# Patient Record
Sex: Female | Born: 2005 | Race: White | Hispanic: Yes | Marital: Single | Smoking: Never smoker
Health system: Southern US, Community
[De-identification: ages and names within clinical notes are randomized; demographics above are authoritative.]

## PROBLEM LIST (undated history)

## (undated) DIAGNOSIS — R51 Headache: Secondary | ICD-10-CM

## (undated) DIAGNOSIS — G479 Sleep disorder, unspecified: Secondary | ICD-10-CM

## (undated) DIAGNOSIS — J45909 Unspecified asthma, uncomplicated: Secondary | ICD-10-CM

## (undated) DIAGNOSIS — E669 Obesity, unspecified: Secondary | ICD-10-CM

## (undated) DIAGNOSIS — F419 Anxiety disorder, unspecified: Secondary | ICD-10-CM

## (undated) DIAGNOSIS — Z91018 Allergy to other foods: Secondary | ICD-10-CM

## (undated) DIAGNOSIS — R519 Headache, unspecified: Secondary | ICD-10-CM

## (undated) HISTORY — DX: Obesity, unspecified: E66.9

## (undated) HISTORY — DX: Anxiety disorder, unspecified: F41.9

## (undated) HISTORY — DX: Allergy to other foods: Z91.018

## (undated) HISTORY — PX: TONSILLECTOMY: SUR1361

## (undated) HISTORY — DX: Sleep disorder, unspecified: G47.9

## (undated) HISTORY — PX: STRABISMUS SURGERY: SHX218

---
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
## 2023-01-04 ENCOUNTER — Ambulatory Visit (INDEPENDENT_AMBULATORY_CARE_PROVIDER_SITE_OTHER): Payer: Commercial Managed Care - PPO

## 2023-01-04 ENCOUNTER — Other Ambulatory Visit: Payer: Self-pay | Admitting: Nurse Practitioner

## 2023-01-04 DIAGNOSIS — J309 Allergic rhinitis, unspecified: Secondary | ICD-10-CM | POA: Diagnosis not present

## 2023-01-04 MED ORDER — TRAZODONE HCL 50 MG PO TABS: 25.0000 mg | ORAL_TABLET | Freq: Every evening | ORAL | 0 refills | Status: DC | PRN

## 2023-01-18 ENCOUNTER — Other Ambulatory Visit: Payer: Self-pay | Admitting: Nurse Practitioner

## 2023-01-18 DIAGNOSIS — E669 Obesity, unspecified: Secondary | ICD-10-CM

## 2023-01-18 DIAGNOSIS — L83 Acanthosis nigricans: Secondary | ICD-10-CM

## 2023-01-25 ENCOUNTER — Telehealth: Payer: Commercial Managed Care - PPO | Admitting: Nurse Practitioner

## 2023-02-08 ENCOUNTER — Ambulatory Visit (INDEPENDENT_AMBULATORY_CARE_PROVIDER_SITE_OTHER): Payer: Commercial Managed Care - PPO | Admitting: *Deleted

## 2023-02-08 DIAGNOSIS — J309 Allergic rhinitis, unspecified: Secondary | ICD-10-CM

## 2023-02-14 DIAGNOSIS — Z0289 Encounter for other administrative examinations: Secondary | ICD-10-CM

## 2023-02-15 ENCOUNTER — Telehealth (INDEPENDENT_AMBULATORY_CARE_PROVIDER_SITE_OTHER): Payer: Commercial Managed Care - PPO | Admitting: Nurse Practitioner

## 2023-02-15 DIAGNOSIS — R0683 Snoring: Secondary | ICD-10-CM

## 2023-02-15 DIAGNOSIS — F419 Anxiety disorder, unspecified: Secondary | ICD-10-CM

## 2023-02-15 DIAGNOSIS — R5383 Other fatigue: Secondary | ICD-10-CM

## 2023-02-15 DIAGNOSIS — G479 Sleep disorder, unspecified: Secondary | ICD-10-CM

## 2023-02-15 MED ORDER — SERTRALINE HCL 50 MG PO TABS: ORAL_TABLET | ORAL | 0 refills | Status: DC

## 2023-02-15 MED ORDER — TRAZODONE HCL 50 MG PO TABS: 25.0000 mg | ORAL_TABLET | Freq: Every evening | ORAL | 0 refills | Status: AC | PRN

## 2023-02-16 ENCOUNTER — Encounter: Payer: Self-pay | Admitting: Nurse Practitioner

## 2023-02-16 NOTE — Progress Notes (Signed)
Virtual Visit via Video Note  I connected with Vanessa [name] on 02/16/23 at  1:40 PM EDT by a video enabled telemedicine application and verified that I am speaking with the correct person using two identifiers.  Location: Patient: home Provider: offive    I discussed the limitations of evaluation and management by telemedicine and the availability of in person appointments. The patient expressed understanding and agreed to proceed.  History of Present Illness: Presents by video for recheck of her anxiety.  Doing well on her current regimen.  Adherent to medication use.  Needs refills today.  Her main concern is continued difficulty with fatigue.  States she is getting plenty of sleep at nighttime with no issues using trazodone.  But staying very tired during the day and is even nodded off at work a few times.  Denies any excessive drowsiness with driving.  Has had a tonsillectomy in the past.  Has been told that she has very loud snoring most nights and her mother has had to wake her up at times due to pauses in her breathing.  Has Mirena for birth control, no cycles.    Observations/Objective: NAD.  Alert, oriented.  Calm cheerful affect. Arby [place]rette questionnaire was completed during the visit which was positive for possible sleep apnea.  See scanned report.  Assessment and Plan: 1. Anxiety Continue current dose of Zoloft as directed, refills given. - sertraline (ZOLOFT) 50 MG tablet; Take one tab po qd  Dispense: 90 tablet; Refill: 0 - Ambulatory referral to Pulmonology  2. Sleep disturbance Continue current dose of trazodone, refills given. - traZODone (DESYREL) 50 MG tablet; Take 0.5-1 tablets (25-50 mg total) by mouth at bedtime as needed for sleep.  Dispense: 90 tablet; Refill: 0 - Ambulatory referral to Pulmonology  3. Snoring Referred to specialist for home-based sleep test for possible OSA. - Ambulatory referral to Pulmonology  4. Fatigue, unspecified type Labs pending. -  Ambulatory referral to Pulmonology - CBC with Differential/Platelet - Comprehensive metabolic panel - TSH   Follow Up Instructions: Encourage patient to eat a healthy diet with regular exercise and discussed the importance of stress reduction.  Further follow-up based on lab results and testing for OSA. Return in about 3 months (around 05/18/2023). Call back sooner if needed.   I discussed the assessment and treatment plan with the patient. The patient was provided an opportunity to ask questions and all were answered. The patient agreed with the plan and demonstrated an understanding of the instructions.   The patient was advised to call back or seek an in-person evaluation if the symptoms worsen or if the condition fails to improve as anticipated.  I provided 20 minutes of non-face-to-face time during this encounter.   Nilda Simmer, NP

## 2023-02-18 ENCOUNTER — Encounter: Payer: Self-pay | Admitting: Nurse Practitioner

## 2023-02-18 ENCOUNTER — Other Ambulatory Visit: Payer: Self-pay | Admitting: *Deleted

## 2023-02-18 DIAGNOSIS — R5383 Other fatigue: Secondary | ICD-10-CM

## 2023-02-18 DIAGNOSIS — L83 Acanthosis nigricans: Secondary | ICD-10-CM

## 2023-02-19 ENCOUNTER — Encounter: Payer: Self-pay | Admitting: Nurse Practitioner

## 2023-02-20 ENCOUNTER — Encounter (INDEPENDENT_AMBULATORY_CARE_PROVIDER_SITE_OTHER): Payer: Commercial Managed Care - PPO | Admitting: Family Medicine

## 2023-02-21 LAB — CMP14+EGFR
ALT: 12 IU/L (ref 0–32)
AST: 10 IU/L (ref 0–40)
Albumin/Globulin Ratio: 1.7 (ref 1.2–2.2)
Albumin: 4.3 g/dL (ref 4.0–5.0)
Alkaline Phosphatase: 92 IU/L (ref 42–106)
BUN/Creatinine Ratio: 14 (ref 9–23)
BUN: 8 mg/dL (ref 6–20)
Bilirubin Total: 0.5 mg/dL (ref 0.0–1.2)
CO2: 21 mmol/L (ref 20–29)
Calcium: 9.8 mg/dL (ref 8.7–10.2)
Chloride: 102 mmol/L (ref 96–106)
Creatinine, Ser: 0.58 mg/dL (ref 0.57–1.00)
Globulin, Total: 2.5 g/dL (ref 1.5–4.5)
Glucose: 92 mg/dL (ref 70–99)
Potassium: 4 mmol/L (ref 3.5–5.2)
Sodium: 139 mmol/L (ref 134–144)
Total Protein: 6.8 g/dL (ref 6.0–8.5)
eGFR: 134 mL/min/{1.73_m2} (ref >59)

## 2023-02-21 LAB — CBC WITH DIFFERENTIAL/PLATELET
Basophils Absolute: 0.1 10*3/uL (ref 0.0–0.2)
Basos: 1 %
EOS (ABSOLUTE): 0.3 10*3/uL (ref 0.0–0.4)
Eos: 2 %
Hematocrit: 34.6 % (ref 34.0–46.6)
Hemoglobin: 10.7 g/dL — ABNORMAL LOW (ref 11.1–15.9)
Immature Grans (Abs): 0.1 10*3/uL (ref 0.0–0.1)
Immature Granulocytes: 1 %
Lymphocytes Absolute: 2.8 10*3/uL (ref 0.7–3.1)
Lymphs: 20 %
MCH: 19.9 pg — ABNORMAL LOW (ref 26.6–33.0)
MCHC: 30.9 g/dL — ABNORMAL LOW (ref 31.5–35.7)
MCV: 64 fL — ABNORMAL LOW (ref 79–97)
Monocytes Absolute: 0.9 10*3/uL (ref 0.1–0.9)
Monocytes: 6 %
Neutrophils Absolute: 9.7 10*3/uL — ABNORMAL HIGH (ref 1.4–7.0)
Neutrophils: 70 %
Platelets: 339 10*3/uL (ref 150–450)
RBC: 5.38 x10E6/uL — ABNORMAL HIGH (ref 3.77–5.28)
RDW: 19.8 % — ABNORMAL HIGH (ref 11.7–15.4)
WBC: 13.7 10*3/uL — ABNORMAL HIGH (ref 3.4–10.8)

## 2023-02-21 LAB — TSH: TSH: 1.22 u[IU]/mL (ref 0.450–4.500)

## 2023-03-01 ENCOUNTER — Ambulatory Visit (INDEPENDENT_AMBULATORY_CARE_PROVIDER_SITE_OTHER): Payer: Commercial Managed Care - PPO

## 2023-03-01 DIAGNOSIS — J309 Allergic rhinitis, unspecified: Secondary | ICD-10-CM | POA: Diagnosis not present

## 2023-03-02 ENCOUNTER — Encounter: Payer: Self-pay | Admitting: Nurse Practitioner

## 2023-03-05 ENCOUNTER — Ambulatory Visit (INDEPENDENT_AMBULATORY_CARE_PROVIDER_SITE_OTHER): Payer: Commercial Managed Care - PPO | Admitting: Pulmonary Disease

## 2023-03-05 ENCOUNTER — Encounter (HOSPITAL_BASED_OUTPATIENT_CLINIC_OR_DEPARTMENT_OTHER): Payer: Self-pay | Admitting: Pulmonary Disease

## 2023-03-05 VITALS — BP 100/60 | HR 64 | Ht 63.78 in | Wt 190.8 lb

## 2023-03-05 DIAGNOSIS — G4710 Hypersomnia, unspecified: Secondary | ICD-10-CM

## 2023-03-05 DIAGNOSIS — G479 Sleep disorder, unspecified: Secondary | ICD-10-CM | POA: Diagnosis not present

## 2023-03-05 DIAGNOSIS — R0683 Snoring: Secondary | ICD-10-CM | POA: Diagnosis not present

## 2023-03-05 DIAGNOSIS — J4520 Mild intermittent asthma, uncomplicated: Secondary | ICD-10-CM

## 2023-03-05 NOTE — Patient Instructions (Signed)
  X Home sleep test  We discussed treatment options  Try to schedule bedtime of 9pm when you have early wake up times

## 2023-03-05 NOTE — Assessment & Plan Note (Addendum)
Given excessive daytime somnolence, narrow pharyngeal exam & loud snoring, obstructive sleep apnea is very likely & an overnight polysomnogram will be scheduled as a home study. The pathophysiology of obstructive sleep apnea , it's cardiovascular consequences & modes of treatment including CPAP were discused with the patient in detail & they evidenced understanding.  Pretest probability is high. Differential diagnosis of hypersomnolence would include insufficient sleep, OSA and narcolepsy in his age group.  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  She can plan for an earlier bedtime at 9 PM She would be willing to consider CPAP if needed although she is distressed by the idea of taking CPAP to college.  If home sleep test is negative then we will plan for detailed testing with an PSG and MSLT

## 2023-03-05 NOTE — Progress Notes (Signed)
Subjective:    Patient ID: Vanessa [name], female    DOB: March 16, 2006, 17 y.o.   MRN: [id number]  HPI  17 year old high school daughter from [place] presents for evaluation of excessive daytime somnolence and fatigue. She reports symptoms ongoing for 3 months.  Epworth sleepiness score is 7.  She was sleepiness while sitting and reading, watching TV, sitting inactive .  She reports a couple of episodes where she had "sleep attacks" and fell asleep talking to somebody and had to be woken up. 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  On those mornings she has to wake up at 4:50 AM.  Other days she wakes up at 7:15 AM.  Bedtime is around 10 AM, sleep latency is minimal, she sleeps on her left side with 1 pillow, reports 2-3 nocturnal awakenings spontaneously and wakes up feeling tired with occasional headaches and dryness of mouth. She has gained 40 pounds in the last 2 years. Endocrine consultation is planned. There is no history suggestive of cataplexy, sleep paralysis or parasomnias  She reports increased anxiety for the past 3 months, try hydroxyzine first and then was given trazodone.  Mom reports a history of obesity and herself and her older sister both of them required gastric bypass. She reports increased anxiety when her stepfather passed away 4 years ago.   PMH -atopic dermatitis on allergy shots every 2 weeks -Dr. Dellis Anes Asthma, mild intermittent. Anxiety  She plans to go to [employer] next year to study public health   Past Medical History:  Diagnosis Date   Asthma    Headache    Idiopathic urticaria 07/27/2022   Intrinsic atopic dermatitis 07/27/2022   Past Surgical History:  Procedure Laterality Date   STRABISMUS SURGERY     TONSILLECTOMY      Allergies  Allergen Reactions   Shellfish Allergy Shortness Of Breath    Social History   Socioeconomic History   Marital status: Single    Spouse name: Not on file   Number of children: Not on file    Years of education: Not on file   Highest education level: Not on file  Occupational History   Not on file  Tobacco Use   Smoking status: Never   Smokeless tobacco: Never  Vaping Use   Vaping Use: Never used  Substance and Sexual Activity   Alcohol use: No   Drug use: No   Sexual activity: Never  Other Topics Concern   Not on file  Social History Narrative   Patient doing well in school.Living with her parents and older siblings. She enjoys dance, swimming, and tennis.    Social Determinants of Health   Financial Resource Strain: Low Risk  (10/26/2022)   Overall Financial Resource Strain (CARDIA)    Difficulty of Paying Living Expenses: Not hard at all  Food Insecurity: No Food Insecurity (10/26/2022)   Hunger Vital Sign    Worried About Running Out of Food in the Last Year: Never true    Ran Out of Food in the Last Year: Never true  Transportation Needs: No Transportation Needs (10/26/2022)   PRAPARE - Administrator, Civil Service (Medical): No    Lack of Transportation (Non-Medical): No  Physical Activity: Inactive (10/26/2022)   Exercise Vital Sign    Days of Exercise per Week: 0 days    Minutes of Exercise per Session: 0 min  Stress: No Stress Concern Present (10/26/2022)   [employer] of Occupational  Health - Occupational Stress Questionnaire    Feeling of Stress : Only a little  Social Connections: Moderately Integrated (10/26/2022)   Social Connection and Isolation Panel [NHANES]    Frequency of Communication with Friends and Family: More than three times a week    Frequency of Social Gatherings with Friends and Family: More than three times a week    Attends Religious Services: 1 to 4 times per year    Active Member of [employer] or Organizations: Yes    Attends Banker Meetings: More than 4 times per year    Marital Status: Never married  Intimate Partner Violence: Not At Risk (10/26/2022)   Humiliation, Afraid, Rape, and Kick  questionnaire    Fear of Current or Ex-Partner: No    Emotionally Abused: No    Physically Abused: No    Sexually Abused: No    Family History  Problem Relation Age of Onset   Migraines Mother    Headache Father    Allergic rhinitis Father    Migraines Sister    Depression Sister    Anxiety disorder Sister    Heart attack Maternal Grandfather    Heart attack Paternal Grandfather    Angioedema Neg Hx    Asthma Neg Hx    Eczema Neg Hx    Immunodeficiency Neg Hx    Urticaria Neg Hx       Review of Systems Constitutional: negative for anorexia, fevers and sweats  Eyes: negative for irritation, redness and visual disturbance  Ears, nose, mouth, throat, and face: negative for earaches, epistaxis, nasal congestion and sore throat  Respiratory: negative for cough, dyspnea on exertion, sputum and wheezing  Cardiovascular: negative for chest pain, dyspnea, lower extremity edema, orthopnea, palpitations and syncope  Gastrointestinal: negative for abdominal pain, constipation, diarrhea, melena, nausea and vomiting  Genitourinary:negative for dysuria, frequency and hematuria  Hematologic/lymphatic: negative for bleeding, easy bruising and lymphadenopathy  Musculoskeletal:negative for arthralgias, muscle weakness and stiff joints  Neurological: negative for coordination problems, gait problems, headaches and weakness  Endocrine: negative for diabetic symptoms including polydipsia, polyuria and weight loss     Objective:   Physical Exam  Gen. Pleasant, obese, in no distress, normal affect ENT - no pallor,icterus, no post nasal drip, class 2-3 airway Neck: No JVD, no thyromegaly, no carotid bruits Lungs: no use of accessory muscles, no dullness to percussion, decreased without rales or rhonchi  Cardiovascular: Rhythm regular, heart sounds  normal, no murmurs or gallops, no peripheral edema Abdomen: soft and non-tender, no hepatosplenomegaly, BS normal. Musculoskeletal: No  deformities, no cyanosis or clubbing Neuro:  alert, non focal, no tremors       Assessment & Plan:

## 2023-03-05 NOTE — Assessment & Plan Note (Addendum)
Appears to be mild intermittent while on allergy shots. Use albuterol on as-needed basis

## 2023-03-07 ENCOUNTER — Other Ambulatory Visit (HOSPITAL_COMMUNITY): Payer: Self-pay

## 2023-03-08 ENCOUNTER — Other Ambulatory Visit: Payer: Self-pay | Admitting: Nurse Practitioner

## 2023-03-08 ENCOUNTER — Ambulatory Visit (INDEPENDENT_AMBULATORY_CARE_PROVIDER_SITE_OTHER): Payer: Commercial Managed Care - PPO

## 2023-03-08 DIAGNOSIS — J309 Allergic rhinitis, unspecified: Secondary | ICD-10-CM | POA: Diagnosis not present

## 2023-03-08 DIAGNOSIS — D709 Neutropenia, unspecified: Secondary | ICD-10-CM

## 2023-03-08 DIAGNOSIS — D509 Iron deficiency anemia, unspecified: Secondary | ICD-10-CM

## 2023-03-08 DIAGNOSIS — D72829 Elevated white blood cell count, unspecified: Secondary | ICD-10-CM

## 2023-03-13 ENCOUNTER — Encounter (INDEPENDENT_AMBULATORY_CARE_PROVIDER_SITE_OTHER): Payer: Self-pay | Admitting: Family Medicine

## 2023-03-13 ENCOUNTER — Other Ambulatory Visit (HOSPITAL_COMMUNITY): Payer: Self-pay

## 2023-03-13 ENCOUNTER — Ambulatory Visit (INDEPENDENT_AMBULATORY_CARE_PROVIDER_SITE_OTHER): Payer: Commercial Managed Care - PPO | Admitting: Family Medicine

## 2023-03-13 VITALS — BP 106/70 | HR 53 | Temp 98.6°F | Ht 64.0 in | Wt 186.0 lb

## 2023-03-13 DIAGNOSIS — Z1331 Encounter for screening for depression: Secondary | ICD-10-CM | POA: Diagnosis not present

## 2023-03-13 DIAGNOSIS — R718 Other abnormality of red blood cells: Secondary | ICD-10-CM | POA: Diagnosis not present

## 2023-03-13 DIAGNOSIS — F411 Generalized anxiety disorder: Secondary | ICD-10-CM | POA: Diagnosis not present

## 2023-03-13 DIAGNOSIS — G479 Sleep disorder, unspecified: Secondary | ICD-10-CM

## 2023-03-13 DIAGNOSIS — R0602 Shortness of breath: Secondary | ICD-10-CM

## 2023-03-13 DIAGNOSIS — Z6832 Body mass index (BMI) 32.0-32.9, adult: Secondary | ICD-10-CM

## 2023-03-13 DIAGNOSIS — E669 Obesity, unspecified: Secondary | ICD-10-CM | POA: Diagnosis not present

## 2023-03-13 DIAGNOSIS — R739 Hyperglycemia, unspecified: Secondary | ICD-10-CM

## 2023-03-13 DIAGNOSIS — R5383 Other fatigue: Secondary | ICD-10-CM | POA: Diagnosis not present

## 2023-03-13 MED ORDER — INTEGRA 62.5-62.5-40-3 MG PO CAPS
1.0000 | ORAL_CAPSULE | Freq: Every day | ORAL | 0 refills | Status: DC
Start: 2023-03-13 — End: 2023-03-18

## 2023-03-13 MED ORDER — INTEGRA 62.5-62.5-40-3 MG PO CAPS
1.0000 | ORAL_CAPSULE | Freq: Every day | ORAL | 0 refills | Status: DC
  Filled 2023-03-13: qty 30, 30d supply, fill #0

## 2023-03-13 NOTE — Progress Notes (Signed)
Chief Complaint:   OBESITY Vanessa [name] (MR# [id number]) is a 17 y.o. female who presents for evaluation and treatment of obesity and related comorbidities. Current BMI is Body mass index is 31.93 kg/m. Vanessa [name] has been struggling with her weight for many years and has been unsuccessful in either losing weight, maintaining weight loss, or reaching her healthy weight goal.  Patient came to information session.  She goes to [employer] and works as a Public relations account executive at 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  Lifeguarding is 3 times a week from 5:30-9am.  School then until 11:50 and works in the office from 12-5p.   Lives at home with her mother Vanessa [name]).  She is anticipating some sabotage from her mother.  Both her mother and sister have had bariatric surgery in the past- mother had gastric bypass and sister had sleeve. Desired weight is 130-140 pounds.  Last time she was this weight was in middle school. Eats out 4-5 times a week at lunch.  Normally goes thru the drive thru at [employer] or [employer].  Gets a burrito or cheeseburger. Dislikes most vegetables except lettuce. Starbucks iced coffee (Iced white chocolate mocha - medium) and a grilled cheese (feels satisfied) at around 9:30.  Eats again at 12pm- this is fast food.  [employer] five layer burrito and a water.  After work she waits for her mom to come home to cook something  Last night she ate 2-3 eggs for dinner. Tends to emotionally eat daily especially at her second job.  She is drinking 1-2 sodas a day.   Vanessa [name] is currently in the action stage of change and ready to dedicate time achieving and maintaining a healthier weight. Vanessa [name] is interested in becoming our patient and working on intensive lifestyle modifications including (but not limited to) diet and exercise for weight loss.  Vanessa [name]'s habits were reviewed today and are as follows: she struggles with family  and or coworkers weight loss sabotage, her desired weight loss is 56 lbs, she has been heavy most of her life, she started gaining weight in the last 5 months, her heaviest weight ever was 190 pounds, she is a picky eater and doesn't like to eat healthier foods, she has significant food cravings issues, she snacks frequently in the evenings, she wakes up frequently in the middle of the night to eat, she is frequently drinking liquids with calories, she frequently makes poor food choices, she has problems with excessive hunger, she frequently eats larger portions than normal, she has binge eating behaviors, and she struggles with emotional eating.  Depression Screen Vanessa [name] Food and Mood (modified PHQ-9) score was 22.  Subjective:   1. Other fatigue Vanessa [name] admits to daytime somnolence and admits to waking up still tired. Patient has a history of symptoms of daytime fatigue, morning fatigue, and morning headache. Vanessa [name] generally gets about 6-8  hours of sleep per night, and states that she has nightime awakenings. Snoring is present. Apneic episodes are present. Epworth Sleepiness Score is 18.  EKG NSR at 55 bpm, sinus bradycardia.  2. SOBOE (shortness of breath on exertion) Vanessa [name] notes increasing shortness of breath with exercising and seems to be worsening over time with weight gain. She notes getting out of breath sooner with activity than she used to. This has not gotten worse recently. Vanessa [name] denies shortness of breath at rest or orthopnea.  3. Microcytosis Mirena placed in  December 2023.  Patient is not on iron, prenatal vitamin or a multivitamin.  MCV, 64, hemoglobin decreased, MCV increased.  4. Hyperglycemia Previously elevated blood sugars.  No A1c in Epic.  5. Sleep disturbance Patient saw Dr. Vassie Loll for evaluation.  Plan is for a home sleep study.  6. GAD (generalized anxiety disorder) Patient is on Zoloft 50 mg.  Patient has been on medications since early February 2024,  symptoms are well-managed.  Assessment/Plan:   1. Other fatigue Vanessa [name] does feel that her weight is causing her energy to be lower than it should be. Fatigue may be related to obesity, depression or many other causes. Labs will be ordered, and in the meanwhile, Vanessa [name] will focus on self care including making healthy food choices, increasing physical activity and focusing on stress reduction.  Check EKG, IC and labs today.  - EKG 12-Lead - Vitamin B12 - T3 - T4, free - VITAMIN D 25 Hydroxy (Vit-D Deficiency, Fractures)  2. SOBOE (shortness of breath on exertion) Vanessa [name] does feel that she gets out of breath more easily that she used to when she exercises. Vanessa [name] shortness of breath appears to be obesity related and exercise induced. She has agreed to work on weight loss and gradually increase exercise to treat her exercise induced shortness of breath. Will continue to monitor closely.  - Lipid Panel With LDL/HDL Ratio  3. Microcytosis Check labs today.  - Folate - Anemia panel  Start- Fe Fum-FePoly-Vit C-Vit B3 (INTEGRA) 62.5-62.5-40-3 MG CAPS; Take 1 capsule by mouth daily.  Dispense: 30 capsule; Refill: 0   4. Hyperglycemia Check labs today.  - Insulin, random - Hemoglobin A1c  5. Sleep disturbance Follow-up results after home sleep study.  6. GAD (generalized anxiety disorder) Continue sertraline.  No change in medication or dosing.  7. Depression screening Vanessa [name] had a positive depression screening. Depression is commonly associated with obesity and often results in emotional eating behaviors. We will monitor this closely and work on CBT to help improve the non-hunger eating patterns. Referral to Psychology may be required if no improvement is seen as she continues in our clinic.  8. Class 1 obesity with serious comorbidity and body mass index (BMI) of 32.0 to 32.9 in adult, unspecified obesity type Vanessa [name] is currently in the action stage of change and her goal is  to continue with weight loss efforts. I recommend Vanessa [name] begin the structured treatment plan as follows:  She has agreed to the Category 2 Plan and keeping a food journal and adhering to recommended goals of 200-250 calories and 20+ protein at breakfast, 300-350 calories and 25+ protein at lunch.  Exercise goals: No exercise has been prescribed at this time.   Behavioral modification strategies: increasing lean protein intake, meal planning and cooking strategies, keeping healthy foods in the home, and planning for success.  She was informed of the importance of frequent follow-up visits to maximize her success with intensive lifestyle modifications for her multiple health conditions. She was informed we would discuss her lab results at her next visit unless there is a critical issue that needs to be addressed sooner. Vanessa [name] agreed to keep her next visit at the agreed upon time to discuss these results.  Objective:   Blood pressure 106/70, pulse (!) 53, temperature 98.6 F (37 C), height 5\' 4"  (1.626 m), weight 186 lb (84.4 kg), last menstrual period 01/19/2023, SpO2 98 %. Body mass index is 31.93 kg/m.  EKG: Normal sinus rhythm, rate 55 bpm.  Indirect Calorimeter completed  today shows a VO2 of 240 and a REE of 1656.  Her calculated basal metabolic rate is 1610 thus her basal metabolic rate is better than expected.  General: Cooperative, alert, well developed, in no acute distress. HEENT: Conjunctivae and lids unremarkable. Cardiovascular: Regular rhythm.  Lungs: Normal work of breathing. Neurologic: No focal deficits.   Lab Results  Component Value Date   CREATININE 0.58 02/20/2023   BUN 8 02/20/2023   NA 139 02/20/2023   K 4.0 02/20/2023   CL 102 02/20/2023   CO2 21 02/20/2023   Lab Results  Component Value Date   ALT 12 02/20/2023   AST 10 02/20/2023   ALKPHOS 92 02/20/2023   BILITOT 0.5 02/20/2023   Lab Results  Component Value Date   HGBA1C 5.2 03/13/2023   Lab  Results  Component Value Date   INSULIN 20.4 03/13/2023   Lab Results  Component Value Date   TSH 1.220 02/20/2023   Lab Results  Component Value Date   CHOL 152 03/13/2023   HDL 50 03/13/2023   LDLCALC 89 03/13/2023   TRIG 67 03/13/2023   CHOLHDL 2.8 01/17/2018   Lab Results  Component Value Date   WBC 13.7 (H) 02/20/2023   HGB 10.7 (L) 02/20/2023   HCT 38.1 03/13/2023   MCV 64 (L) 02/20/2023   PLT 339 02/20/2023   Lab Results  Component Value Date   IRON 76 03/13/2023   TIBC 392 03/13/2023   FERRITIN 45 03/13/2023   Attestation Statements:   Reviewed by clinician on day of visit: allergies, medications, problem list, medical history, surgical history, family history, social history, and previous encounter notes.  Time spent on visit including pre-visit chart review and post-visit charting and care was 45 minutes.   I, Malcolm Metro, RMA, am acting as transcriptionist for Reuben Likes, MD. This is the patient's first visit at [facility]. The patient's NEW PATIENT PACKET was reviewed at length. Included in the packet: current and past health history, medications, allergies, ROS, gynecologic history (women only), surgical history, family history, social history, weight history, weight loss surgery history (for those that have had weight loss surgery), nutritional evaluation, mood and food questionnaire, PHQ9, Epworth questionnaire, sleep habits questionnaire, patient life and health improvement goals questionnaire. These will all be scanned into the patient's chart under media.   During the visit, I independently reviewed the patient's EKG, bioimpedance scale results, and indirect calorimeter results. I used this information to tailor a meal plan for the patient that will help her to lose weight and will improve her obesity-related conditions going forward. I performed a medically necessary appropriate examination and/or evaluation. I discussed the  assessment and treatment plan with the patient. The patient was provided an opportunity to ask questions and all were answered. The patient agreed with the plan and demonstrated an understanding of the instructions. Labs were ordered at this visit and will be reviewed at the next visit unless more critical results need to be addressed immediately. Clinical information was updated and documented in the EMR.    I have reviewed the above documentation for accuracy and completeness, and I agree with the above. - Reuben Likes, MD

## 2023-03-14 ENCOUNTER — Other Ambulatory Visit (HOSPITAL_COMMUNITY): Payer: Self-pay

## 2023-03-14 LAB — ANEMIA PANEL
Ferritin: 45 ng/mL (ref 15–77)
Folate, Hemolysate: 317 ng/mL
Folate, RBC: 832 ng/mL (ref >498)
Hematocrit: 38.1 % (ref 34.0–46.6)
Iron Saturation: 19 % (ref 15–55)
Iron: 76 ug/dL (ref 27–159)
Retic Ct Pct: 2.2 % (ref 0.6–2.6)
Total Iron Binding Capacity: 392 ug/dL (ref 250–450)
UIBC: 316 ug/dL (ref 131–425)
Vitamin B-12: 239 pg/mL (ref 232–1245)

## 2023-03-14 LAB — T4, FREE: Free T4: 1.23 ng/dL (ref 0.93–1.60)

## 2023-03-14 LAB — HEMOGLOBIN A1C
Est. average glucose Bld gHb Est-mCnc: 103 mg/dL
Hgb A1c MFr Bld: 5.2 % (ref 4.8–5.6)

## 2023-03-14 LAB — LIPID PANEL WITH LDL/HDL RATIO
Cholesterol, Total: 152 mg/dL (ref 100–169)
HDL: 50 mg/dL (ref >39)
LDL Chol Calc (NIH): 89 mg/dL (ref 0–109)
LDL/HDL Ratio: 1.8 ratio (ref 0.0–3.2)
Triglycerides: 67 mg/dL (ref 0–89)
VLDL Cholesterol Cal: 13 mg/dL (ref 5–40)

## 2023-03-14 LAB — VITAMIN D 25 HYDROXY (VIT D DEFICIENCY, FRACTURES): Vit D, 25-Hydroxy: 19.8 ng/mL — ABNORMAL LOW (ref 30.0–100.0)

## 2023-03-14 LAB — INSULIN, RANDOM: INSULIN: 20.4 u[IU]/mL (ref 2.6–24.9)

## 2023-03-14 LAB — FOLATE: Folate: 6 ng/mL (ref >3.0)

## 2023-03-14 LAB — T3: T3, Total: 150 ng/dL (ref 71–180)

## 2023-03-17 NOTE — Progress Notes (Signed)
[facility] [name], [place] [address]   Clinic Day:  03/18/2023  Referring physician: Campbell Riches, NP  Patient Care Team: Tommie Sams, DO as PCP - General (Family Medicine)   ASSESSMENT & PLAN:   Assessment:  1.  RBC microcytosis and leukocytosis: - Seen at the request of Baruch Merl, NP - CBC (02/20/2023): Hb-10.7, MCV-64. - 03/13/2023: Ferritin-45, percent saturation 19, folate-6, B12-239 - Integra (FeSO4) started on 03/15/2023 - History of menorrhagia and dysmenorrhea, Mirena IUD placed, no bleeding since February - Complains of fatigue, mostly in the middle of the day since February 2024  2.  Social/family history: - Mother has microcytosis, maternal grandmother also has microcytosis.  No family history of leukemia or malignancies.  No family history of known thalassemia or sickle cell anemia.  Plan:  1.  RBC microcytosis: - She has slightly low iron levels.  She started Integra tablet on 03/15/2023 and tolerated well so far.  She will continue the Integra tablet daily.  She will use stool softener if needed. - She also has borderline B12 levels.  She was told to take B12 1 mg tablet daily. - She has severe microcytosis in the past even with normal hemoglobin.  Suspect she has thalassemia trait. - I plan to see her back in 3 months.  I plan to check her CBC, ferritin, iron panel.  Will also check methylmalonic acid and copper levels. - I will check hemoglobin electrophoresis prior to next visit.  2.  Leukocytosis, predominantly neutrophils: - She has neutrophilic leukocytosis since 2019.  No B symptoms.  No prior history of splenectomy. - Will check for MPN including JAK2 V617F and BCR/ABL at next visit.  Orders Placed This Encounter  Procedures   CBC with Differential/Platelet    Standing Status:   Future    Standing Expiration Date:   03/17/2024    Order Specific Question:   Release to patient    Answer:   Immediate    Reticulocytes    Standing Status:   Future    Standing Expiration Date:   03/17/2024   Ferritin    Standing Status:   Future    Standing Expiration Date:   03/17/2024    Order Specific Question:   Release to patient    Answer:   Immediate   Lactate dehydrogenase    Standing Status:   Future    Standing Expiration Date:   03/17/2024    Order Specific Question:   Release to patient    Answer:   Immediate   Iron and TIBC    Standing Status:   Future    Standing Expiration Date:   03/17/2024    Order Specific Question:   Release to patient    Answer:   Immediate   Methylmalonic acid, serum    Standing Status:   Future    Standing Expiration Date:   03/17/2024   Copper, serum    Standing Status:   Future    Standing Expiration Date:   03/17/2024   JAK2 V617F rfx CALR/MPL/E12-15    Standing Status:   Future    Standing Expiration Date:   03/17/2024   BCR-ABL1 FISH    Standing Status:   Future    Standing Expiration Date:   03/17/2024   Hgb Fractionation Cascade    Standing Status:   Future    Standing Expiration Date:   03/17/2024      I,Katie Daubenspeck,acting as a scribe for  [name], MD.,have documented all relevant documentation on the behalf of [name], MD,as directed by  [name], MD while in the presence of [name], MD.   I, [name] MD, have reviewed the above documentation for accuracy and completeness, and I agree with the above.   [name], MD   4/22/20244:38 PM  CHIEF COMPLAINT/PURPOSE OF CONSULT:   Diagnosis: RBC microcytosis and leukocytosis  Current Therapy: Iron tablet and B12 tablet daily  HISTORY OF PRESENT ILLNESS:   Vanessa [name] is a 17 y.o. female presenting to clinic today for evaluation of anemia at the request of Sherie Don, NP.  Today, she states that she is doing well overall. Her appetite level is at 100%. Her energy level is at 40%.  She was found to have abnormal CBC from  02/20/23, at which time her WBC was 13.7 and hemoglobin 10.7. This was followed up by an anemia panel on 03/13/23, and results were all WNL-- iron 76, ferritin 45. She denies recent chest pain on exertion, shortness of breath on minimal exertion, pre-syncopal episodes, or palpitations. She had not noticed any recent bleeding such as epistaxis, hematuria or hematochezia The patient denies over the counter NSAID ingestion. She is not  on antiplatelets agents. She had no prior history or diagnosis of cancer. She denies any family history of cancer.  She denies any pica and eats a variety of diet. She never donated blood or received blood transfusion.  PAST MEDICAL HISTORY:   Past Medical History: Past Medical History:  Diagnosis Date   Anxiety    Asthma    Headache    Idiopathic urticaria 07/27/2022   Intrinsic atopic dermatitis 07/27/2022   Multiple food allergies    Obesity    Sleep disturbance     Surgical History: Past Surgical History:  Procedure Laterality Date   STRABISMUS SURGERY     TONSILLECTOMY      Social History: Social History   Socioeconomic History   Marital status: Single    Spouse name: Not on file   Number of children: Not on file   Years of education: Not on file   Highest education level: Not on file  Occupational History   Occupation: Consulting civil engineer, Work 2 jobs  Tobacco Use   Smoking status: Never   Smokeless tobacco: Never  Building services engineer Use: Never used  Substance and Sexual Activity   Alcohol use: No   Drug use: No   Sexual activity: Never  Other Topics Concern   Not on file  Social History Narrative   Patient doing well in school.Living with her parents and older siblings. She enjoys dance, swimming, and tennis.    Social Determinants of Health   Financial Resource Strain: Low Risk  (10/26/2022)   Overall Financial Resource Strain (CARDIA)    Difficulty of Paying Living Expenses: Not hard at all  Food Insecurity: No Food Insecurity  (03/18/2023)   Hunger Vital Sign    Worried About Running Out of Food in the Last Year: Never true    Ran Out of Food in the Last Year: Never true  Transportation Needs: No Transportation Needs (03/18/2023)   PRAPARE - Administrator, Civil Service (Medical): No    Lack of Transportation (Non-Medical): No  Physical Activity: Inactive (10/26/2022)   Exercise Vital Sign    Days of Exercise per Week: 0 days    Minutes of Exercise per Session: 0 min  Stress: No Stress Concern Present (10/26/2022)  Vanessa [name] of Occupational Health - Occupational Stress Questionnaire    Feeling of Stress : Only a little  Social Connections: Moderately Integrated (10/26/2022)   Social Connection and Isolation Panel [NHANES]    Frequency of Communication with Friends and Family: More than three times a week    Frequency of Social Gatherings with Friends and Family: More than three times a week    Attends Religious Services: 1 to 4 times per year    Active Member of [employer] or Organizations: Yes    Attends Engineer, structural: More than 4 times per year    Marital Status: Never married  Intimate Partner Violence: Not At Risk (03/18/2023)   Humiliation, Afraid, Rape, and Kick questionnaire    Fear of Current or Ex-Partner: No    Emotionally Abused: No    Physically Abused: No    Sexually Abused: No    Family History: Family History  Problem Relation Age of Onset   Cancer Mother    Hyperlipidemia Mother    Hypertension Mother    Diabetes Mother    Migraines Mother    Obesity Mother    Hyperlipidemia Father    Hypertension Father    Headache Father    Allergic rhinitis Father    Sleep apnea Father    Alcoholism Father    Migraines Sister    Depression Sister    Anxiety disorder Sister    Heart attack Maternal Grandfather    Heart attack Paternal Grandfather    Angioedema Neg Hx    Asthma Neg Hx    Eczema Neg Hx    Immunodeficiency Neg Hx    Urticaria Neg Hx      Current Medications:  Current Outpatient Medications:    albuterol (PROVENTIL) (2.5 MG/3ML) 0.083% nebulizer solution, Take 3 mLs (2.5 mg total) by nebulization every 4 (four) hours as needed for wheezing or shortness of breath., Disp: 75 mL, Rfl: 1   albuterol (VENTOLIN HFA) 108 (90 Base) MCG/ACT inhaler, Inhale 2 puffs into the lungs every 4 (four) hours as needed for wheezing or shortness of breath., Disp: 13.4 g, Rfl: 1   levonorgestrel (MIRENA) 20 MCG/DAY IUD, 1 each by Intrauterine route once., Disp: , Rfl:    sertraline (ZOLOFT) 50 MG tablet, Take one tab po qd, Disp: 90 tablet, Rfl: 0   traZODone (DESYREL) 50 MG tablet, Take 0.5-1 tablets (25-50 mg total) by mouth at bedtime as needed for sleep., Disp: 90 tablet, Rfl: 0   Fe Fum-FePoly-Vit C-Vit B3 (INTEGRA) 62.5-62.5-40-3 MG CAPS, Take 1 capsule by mouth daily., Disp: 30 capsule, Rfl: 6   Allergies: Allergies  Allergen Reactions   Shellfish Allergy Shortness Of Breath    REVIEW OF SYSTEMS:   Review of Systems  Constitutional:  Negative for chills, fatigue and fever.  HENT:   Negative for lump/mass, mouth sores, nosebleeds, sore throat and trouble swallowing.   Eyes:  Negative for eye problems.  Respiratory:  Negative for cough and shortness of breath.   Cardiovascular:  Negative for chest pain, leg swelling and palpitations.  Gastrointestinal:  Negative for abdominal pain, constipation, diarrhea, nausea and vomiting.  Genitourinary:  Negative for bladder incontinence, difficulty urinating, dysuria, frequency, hematuria and nocturia.   Musculoskeletal:  Negative for arthralgias, back pain, flank pain, myalgias and neck pain.  Skin:  Negative for itching and rash.  Neurological:  Negative for dizziness, headaches and numbness.  Hematological:  Does not bruise/bleed easily.  Psychiatric/Behavioral:  Positive for sleep disturbance. Negative for depression  and suicidal ideas. The patient is not nervous/anxious.   All other  systems reviewed and are negative.    VITALS:   Blood pressure 118/74, pulse 79, temperature 99.5 F (37.5 C), temperature source Tympanic, resp. rate 18, height 5\' 4"  (1.626 m), weight 188 lb 6.4 oz (85.5 kg), last menstrual period 01/19/2023, SpO2 100 %.  Wt Readings from Last 3 Encounters:  03/18/23 188 lb 6.4 oz (85.5 kg) (96 %, Z= 1.80)*  03/13/23 186 lb (84.4 kg) (96 %, Z= 1.76)*  03/05/23 190 lb 12.8 oz (86.5 kg) (97 %, Z= 1.84)*   * Growth percentiles are based on CDC (Girls, 2-20 Years) data.    Body mass index is 32.34 kg/m.   PHYSICAL EXAM:   Physical Exam Vitals and nursing note reviewed. Exam conducted with a chaperone present.  Constitutional:      Appearance: Normal appearance.  Cardiovascular:     Rate and Rhythm: Normal rate and regular rhythm.     Pulses: Normal pulses.     Heart sounds: Normal heart sounds.  Pulmonary:     Effort: Pulmonary effort is normal.     Breath sounds: Normal breath sounds.  Abdominal:     Palpations: Abdomen is soft. There is no hepatomegaly, splenomegaly or mass.     Tenderness: There is no abdominal tenderness.  Musculoskeletal:     Right lower leg: No edema.     Left lower leg: No edema.  Lymphadenopathy:     Cervical: No cervical adenopathy.     Right cervical: No superficial, deep or posterior cervical adenopathy.    Left cervical: No superficial, deep or posterior cervical adenopathy.     Upper Body:     Right upper body: No supraclavicular or axillary adenopathy.     Left upper body: No supraclavicular or axillary adenopathy.  Neurological:     General: No focal deficit present.     Mental Status: She is alert and oriented to person, place, and time.  Psychiatric:        Mood and Affect: Mood normal.        Behavior: Behavior normal.     LABS:      Latest Ref Rng & Units 03/13/2023    9:05 AM 02/20/2023    3:23 PM 07/29/2020    4:23 PM  CBC  WBC 3.4 - 10.8 x10E3/uL  13.7  11.8   Hemoglobin 11.1 - 15.9 g/dL   57.8  46.9   Hematocrit 34.0 - 46.6 % 38.1  34.6  36.3   Platelets 150 - 450 x10E3/uL  339  348       Latest Ref Rng & Units 02/20/2023    3:23 PM 07/29/2020    4:23 PM 05/25/2018    1:48 PM  CMP  Glucose 70 - 99 mg/dL 92  83  629   BUN 6 - 20 mg/dL 8  8  5    Creatinine 0.57 - 1.00 mg/dL 5.28  4.13  2.44   Sodium 134 - 144 mmol/L 139  137  138   Potassium 3.5 - 5.2 mmol/L 4.0  4.3  3.3   Chloride 96 - 106 mmol/L 102  101  105   CO2 20 - 29 mmol/L 21  21  24    Calcium 8.7 - 10.2 mg/dL 9.8  9.9  8.8   Total Protein 6.0 - 8.5 g/dL 6.8  7.4  7.7   Total Bilirubin 0.0 - 1.2 mg/dL 0.5  0.4  0.8   Alkaline Phos  42 - 106 IU/L 92  109  156   AST 0 - 40 IU/L 10  9  13    ALT 0 - 32 IU/L 12  7  11       No results found for: "CEA1", "CEA" / No results found for: "CEA1", "CEA" No results found for: "PSA1" No results found for: "ZOX096" No results found for: "CAN125"  No results found for: "TOTALPROTELP", "ALBUMINELP", "A1GS", "A2GS", "BETS", "BETA2SER", "GAMS", "MSPIKE", "SPEI" Lab Results  Component Value Date   TIBC 392 03/13/2023   TIBC 389 07/29/2020   FERRITIN 45 03/13/2023   FERRITIN 33 07/29/2020   IRONPCTSAT 19 03/13/2023   IRONPCTSAT 12 (L) 07/29/2020   No results found for: "LDH"   STUDIES:   No results found.

## 2023-03-18 ENCOUNTER — Other Ambulatory Visit (HOSPITAL_COMMUNITY): Payer: Self-pay

## 2023-03-18 ENCOUNTER — Inpatient Hospital Stay: Payer: Commercial Managed Care - PPO | Attending: Hematology | Admitting: Hematology

## 2023-03-18 VITALS — BP 118/74 | HR 79 | Temp 99.5°F | Resp 18 | Ht 64.0 in | Wt 188.4 lb

## 2023-03-18 DIAGNOSIS — R718 Other abnormality of red blood cells: Secondary | ICD-10-CM

## 2023-03-18 DIAGNOSIS — D72829 Elevated white blood cell count, unspecified: Secondary | ICD-10-CM | POA: Insufficient documentation

## 2023-03-18 DIAGNOSIS — D649 Anemia, unspecified: Secondary | ICD-10-CM | POA: Insufficient documentation

## 2023-03-18 DIAGNOSIS — D72825 Bandemia: Secondary | ICD-10-CM

## 2023-03-18 MED ORDER — INTEGRA 62.5-62.5-40-3 MG PO CAPS
1.0000 | ORAL_CAPSULE | Freq: Every day | ORAL | 6 refills | Status: DC
Start: 2023-03-18 — End: 2023-11-26
  Filled 2023-03-18: qty 30, 30d supply, fill #0

## 2023-03-18 NOTE — Patient Instructions (Addendum)
[facility]  Discharge Instructions  You were seen and examined today by Dr. Ellin Saba. Dr. Ellin Saba is a hematologist, meaning that he specializes in blood abnormalities. Dr. Ellin Saba discussed your past medical history, family history of cancers/blood conditions and the events that led to you being here today.  You were referred to Dr. Ellin Saba due to low iron levels.  Dr. Ellin Saba want you to continue taking the oral Iron until you follow up with him. You can also take Vitamin B12 over the counter.  Dr. Ellin Saba has recommended additional labs to be drawn for further evaluation before your follow up.  Follow-up as scheduled.    Thank you for choosing [facility] to provide your oncology and hematology care.   To afford each patient quality time with our provider, please arrive at least 15 minutes before your scheduled appointment time. You may need to reschedule your appointment if you arrive late (10 or more minutes). Arriving late affects you and other patients whose appointments are after yours.  Also, if you miss three or more appointments without notifying the office, you may be dismissed from the clinic at the provider's discretion.    Again, thank you for choosing [facility].  Our hope is that these requests will decrease the amount of time that you wait before being seen by our physicians.   If you have a lab appointment with the Cancer Center - please note that after April 8th, all labs will be drawn in the cancer center.  You do not have to check in or register with the main entrance as you have in the past but will complete your check-in at the cancer center.            _____________________________________________________________  Should you have questions after your visit to [facility], please contact our office at [phone] and follow the prompts.  Our office hours are 8:00  a.m. to 4:30 p.m. Monday - Thursday and 8:00 a.m. to 2:30 p.m. Friday.  Please note that voicemails left after 4:00 p.m. may not be returned until the following business day.  We are closed weekends and all major holidays.  You do have access to a nurse 24-7, just call the main number to the clinic [phone] and do not press any options, hold on the line and a nurse will answer the phone.    For prescription refill requests, have your pharmacy contact our office and allow 72 hours.    Masks are no longer required in the cancer centers. If you would like for your care team to wear a mask while they are taking care of you, please let them know. You may have one support person who is at least 19 years old accompany you for your appointments.

## 2023-03-21 ENCOUNTER — Ambulatory Visit: Payer: Commercial Managed Care - PPO | Admitting: Nurse Practitioner

## 2023-03-22 ENCOUNTER — Ambulatory Visit (INDEPENDENT_AMBULATORY_CARE_PROVIDER_SITE_OTHER): Payer: Commercial Managed Care - PPO

## 2023-03-22 DIAGNOSIS — J309 Allergic rhinitis, unspecified: Secondary | ICD-10-CM | POA: Diagnosis not present

## 2023-03-25 ENCOUNTER — Emergency Department (HOSPITAL_COMMUNITY): Payer: Commercial Managed Care - PPO

## 2023-03-25 ENCOUNTER — Emergency Department (HOSPITAL_COMMUNITY)
Admission: EM | Admit: 2023-03-25 | Discharge: 2023-03-26 | Disposition: A | Discharge status: Home / Self Care | Payer: Commercial Managed Care - PPO | Attending: Emergency Medicine | Admitting: Emergency Medicine

## 2023-03-25 ENCOUNTER — Encounter (HOSPITAL_COMMUNITY): Payer: Self-pay

## 2023-03-25 ENCOUNTER — Other Ambulatory Visit: Payer: Self-pay

## 2023-03-25 DIAGNOSIS — N12 Tubulo-interstitial nephritis, not specified as acute or chronic: Secondary | ICD-10-CM | POA: Insufficient documentation

## 2023-03-25 DIAGNOSIS — R109 Unspecified abdominal pain: Secondary | ICD-10-CM

## 2023-03-25 DIAGNOSIS — J45909 Unspecified asthma, uncomplicated: Secondary | ICD-10-CM | POA: Insufficient documentation

## 2023-03-25 DIAGNOSIS — Z7951 Long term (current) use of inhaled steroids: Secondary | ICD-10-CM | POA: Insufficient documentation

## 2023-03-25 DIAGNOSIS — D72829 Elevated white blood cell count, unspecified: Secondary | ICD-10-CM | POA: Insufficient documentation

## 2023-03-25 LAB — CBC
HCT: 38.5 % (ref 36.0–46.0)
Hemoglobin: 12.1 g/dL (ref 12.0–15.0)
MCH: 19.9 pg — ABNORMAL LOW (ref 26.0–34.0)
MCHC: 31.4 g/dL (ref 30.0–36.0)
MCV: 63.4 fL — ABNORMAL LOW (ref 80.0–100.0)
Platelets: 327 10*3/uL (ref 150–400)
RBC: 6.07 MIL/uL — ABNORMAL HIGH (ref 3.87–5.11)
RDW: 20.1 % — ABNORMAL HIGH (ref 11.5–15.5)
WBC: 15.2 10*3/uL — ABNORMAL HIGH (ref 4.0–10.5)
nRBC: 0 % (ref 0.0–0.2)

## 2023-03-25 LAB — URINALYSIS, ROUTINE W REFLEX MICROSCOPIC
Bilirubin Urine: NEGATIVE
Glucose, UA: NEGATIVE mg/dL
Ketones, ur: NEGATIVE mg/dL
Nitrite: NEGATIVE
Protein, ur: NEGATIVE mg/dL
Specific Gravity, Urine: 1.019 (ref 1.005–1.030)
WBC, UA: >50 WBC/hpf (ref 0–5)
pH: 6 (ref 5.0–8.0)

## 2023-03-25 LAB — COMPREHENSIVE METABOLIC PANEL
ALT: 13 U/L (ref 0–44)
AST: 13 U/L — ABNORMAL LOW (ref 15–41)
Albumin: 4.5 g/dL (ref 3.5–5.0)
Alkaline Phosphatase: 80 U/L (ref 38–126)
Anion gap: 9 (ref 5–15)
BUN: 13 mg/dL (ref 6–20)
CO2: 25 mmol/L (ref 22–32)
Calcium: 9.1 mg/dL (ref 8.9–10.3)
Chloride: 103 mmol/L (ref 98–111)
Creatinine, Ser: 0.5 mg/dL (ref 0.44–1.00)
GFR, Estimated: >60 mL/min (ref >60)
Glucose, Bld: 106 mg/dL — ABNORMAL HIGH (ref 70–99)
Potassium: 3.8 mmol/L (ref 3.5–5.1)
Sodium: 137 mmol/L (ref 135–145)
Total Bilirubin: 0.7 mg/dL (ref 0.3–1.2)
Total Protein: 8.2 g/dL — ABNORMAL HIGH (ref 6.5–8.1)

## 2023-03-25 LAB — PREGNANCY, URINE: Preg Test, Ur: NEGATIVE

## 2023-03-25 LAB — HCG, QUANTITATIVE, PREGNANCY: hCG, Beta Chain, Quant, S: <1 m[IU]/mL (ref <5)

## 2023-03-25 LAB — POC URINE PREG, ED: Preg Test, Ur: POSITIVE — AB

## 2023-03-25 MED ORDER — CEPHALEXIN 500 MG PO CAPS: 500.0000 mg | ORAL_CAPSULE | Freq: Four times a day (QID) | ORAL | 0 refills | Status: DC

## 2023-03-25 NOTE — Discharge Instructions (Addendum)
You were diagnosed today with a urinary tract infection which has affected the kidney.  I have prescribed antibiotics for coverage.  Please follow-up with me with your primary care provider.  You develop intractable nausea and vomiting or severe pain please return to the emergency department.

## 2023-03-25 NOTE — ED Triage Notes (Signed)
Pt arrived from home via POV c/o right flank pain 6/10 on pain scale accompanied with n/v. Emesis x 2 since this afternoon. Pt states that she noticed burning with urination 2 days ago and did not get it treated. Dysuria remains 7/10 when voiding.

## 2023-03-25 NOTE — ED Provider Notes (Signed)
EMERGENCY DEPARTMENT AT [facility] Provider Note   CSN: [id number] Arrival date & time: 03/25/23  1951     History  Chief Complaint  Patient presents with   Flank Pain    Vanessa [name] is a 17 y.o. female.  Patient presents the emergency room complaining of right-sided flank pain for the past 2 days.  She also endorses nausea, vomiting, and painful urination which also began 2 days ago.  She denies chest pain, shortness of breath, vaginal discharge, vaginal bleeding.  Past medical history significant for asthma, anxiety  HPI     Home Medications Prior to Admission medications   Medication Sig Start Date End Date Taking? Authorizing Provider  cephALEXin (KEFLEX) 500 MG capsule Take 1 capsule (500 mg total) by mouth 4 (four) times daily. 03/25/23  Yes Barrie Dunker B, PA-C  albuterol (PROVENTIL) (2.5 MG/3ML) 0.083% nebulizer solution Take 3 mLs (2.5 mg total) by nebulization every 4 (four) hours as needed for wheezing or shortness of breath. 07/27/22   Hetty Blend, FNP  albuterol (VENTOLIN HFA) 108 (90 Base) MCG/ACT inhaler Inhale 2 puffs into the lungs every 4 (four) hours as needed for wheezing or shortness of breath. 07/27/22   Ambs, Norvel Richards, FNP  Fe Fum-FePoly-Vit C-Vit B3 (INTEGRA) 62.5-62.5-40-3 MG CAPS Take 1 capsule by mouth daily. 03/18/23   [name], MD  levonorgestrel (MIRENA) 20 MCG/DAY IUD 1 each by Intrauterine route once.    [provider]  sertraline (ZOLOFT) 50 MG tablet Take one tab po qd 02/15/23   Campbell Riches, NP  traZODone (DESYREL) 50 MG tablet Take 0.5-1 tablets (25-50 mg total) by mouth at bedtime as needed for sleep. 02/15/23   Campbell Riches, NP      Allergies    Shellfish allergy    Review of Systems   Review of Systems  Physical Exam Updated Vital Signs BP 129/81 (BP Location: Right Arm)   Pulse 76   Temp 99 F (37.2 C) (Oral)   Resp 17   Ht 5\' 4"  (1.626 m)   Wt 85.3 kg   LMP 01/19/2023  (Approximate)   SpO2 100%   BMI 32.27 kg/m  Physical Exam Vitals and nursing note reviewed.  Constitutional:      General: She is not in acute distress.    Appearance: She is well-developed.  HENT:     Head: Normocephalic and atraumatic.  Eyes:     Conjunctiva/sclera: Conjunctivae normal.  Cardiovascular:     Rate and Rhythm: Normal rate and regular rhythm.     Heart sounds: No murmur heard. Pulmonary:     Effort: Pulmonary effort is normal. No respiratory distress.     Breath sounds: Normal breath sounds.  Abdominal:     Palpations: Abdomen is soft.     Tenderness: There is no abdominal tenderness. There is right CVA tenderness (Mild).  Musculoskeletal:        General: No swelling.     Cervical back: Neck supple.  Skin:    General: Skin is warm and dry.     Capillary Refill: Capillary refill takes less than 2 seconds.  Neurological:     Mental Status: She is alert.  Psychiatric:        Mood and Affect: Mood normal.     ED Results / Procedures / Treatments   Labs (all labs ordered are listed, but only abnormal results are displayed) Labs Reviewed  URINALYSIS, ROUTINE W REFLEX MICROSCOPIC - Abnormal; Notable for  the following components:      Result Value   APPearance HAZY (*)    Hgb urine dipstick MODERATE (*)    Leukocytes,Ua SMALL (*)    Bacteria, UA RARE (*)    All other components within normal limits  CBC - Abnormal; Notable for the following components:   WBC 15.2 (*)    RBC 6.07 (*)    MCV 63.4 (*)    MCH 19.9 (*)    RDW 20.1 (*)    All other components within normal limits  COMPREHENSIVE METABOLIC PANEL - Abnormal; Notable for the following components:   Glucose, Bld 106 (*)    Total Protein 8.2 (*)    AST 13 (*)    All other components within normal limits  POC URINE PREG, ED - Abnormal; Notable for the following components:   Preg Test, Ur Positive (*)    All other components within normal limits  HCG, QUANTITATIVE, PREGNANCY  PREGNANCY, URINE     EKG None  Radiology CT Renal Stone Study  Result Date: 03/25/2023 CLINICAL DATA:  Right-sided flank pain EXAM: CT ABDOMEN AND PELVIS WITHOUT CONTRAST TECHNIQUE: Multidetector CT imaging of the abdomen and pelvis was performed following the standard protocol without IV contrast. RADIATION DOSE REDUCTION: This exam was performed according to the departmental dose-optimization program which includes automated exposure control, adjustment of the mA and/or kV according to patient size and/or use of iterative reconstruction technique. COMPARISON:  None Available. FINDINGS: Lower chest: No acute abnormality. Hepatobiliary: No focal liver abnormality is seen. No gallstones, gallbladder wall thickening, or biliary dilatation. Pancreas: Unremarkable. No pancreatic ductal dilatation or surrounding inflammatory changes. Spleen: Normal in size without focal abnormality. Adrenals/Urinary Tract: Adrenal glands are within normal limits. Kidneys demonstrate a normal appearance bilaterally. No renal or ureteral calculi seen. No obstructive changes are noted. Bladder is decompressed. Stomach/Bowel: The appendix is within normal limits. No obstructive or inflammatory changes of the colon are noted. The stomach and small bowel are within normal limits. Vascular/Lymphatic: No significant vascular findings are present. No enlarged abdominal or pelvic lymph nodes. Reproductive: Uterus and bilateral adnexa are unremarkable. IUD is noted in place. Other: No abdominal wall hernia or abnormality. No abdominopelvic ascites. Musculoskeletal: No acute or significant osseous findings. IMPRESSION: No renal calculi or obstructive changes are noted. Normal-appearing appendix. Electronically Signed   By: Alcide Clever M.D.   On: 03/25/2023 23:24    Procedures Procedures    Medications Ordered in ED Medications - No data to display  ED Course/ Medical Decision Making/ A&P                             Medical Decision  Making Amount and/or Complexity of Data Reviewed Labs: ordered. Radiology: ordered.   Patient presents with a chief complaint of right-sided flank pain.  Differential diagnosis includes but not limited to nephrolithiasis, hydronephrosis, pyelonephritis, musculoskeletal strain, and others  I ordered and reviewed labs.  Pertinent results include WBC 15.2, normal creatinine, negative pregnancy test, UA with moderate hemoglobin, small leukocytes, rare bacteria  I ordered and interpreted imaging including a CT renal stone study.  Imaging showed no signs of renal stones or other acute abnormalities.  I agree with the radiologist findings.  Patient's presentation consistent with a mild pyelonephritis.  Plan to discharge home on antibiotics.  Plan for follow-up as needed with primary care.        Final Clinical Impression(s) / ED Diagnoses Final diagnoses:  Right flank pain  Pyelonephritis    Rx / DC Orders ED Discharge Orders          Ordered    cephALEXin (KEFLEX) 500 MG capsule  4 times daily        03/25/23 2351              Pamala Duffel 03/25/23 2353    Pricilla Loveless, MD 03/29/23 2218

## 2023-03-27 ENCOUNTER — Telehealth: Payer: Self-pay

## 2023-03-27 NOTE — Transitions of Care (Post Inpatient/ED Visit) (Signed)
   03/27/2023  Name: Vanessa [name] MRN: [id number] DOB: [birth year]  Today's TOC FU Call Status: Today's TOC FU Call Status:: Successful TOC FU Call Competed TOC FU Call Complete Date: 03/27/23  Transition Care Management Follow-up Telephone Call Date of Discharge: 03/26/23 [facility] Type of Discharge: Emergency Department Reason for ED Visit: Other: (abd pain) How have you been since you were released from the hospital?: Better Any questions or concerns?: No  Items Reviewed: Did you receive and understand the discharge instructions provided?: Yes Medications obtained,verified, and reconciled?: Yes (Medications Reviewed) Any new allergies since your discharge?: No Dietary orders reviewed?: Yes Do you have support at home?: No  Medications Reviewed Today: Medications Reviewed Today     Reviewed by Karena Addison, LPN (Licensed Practical Nurse) on 03/27/23 at 1412  Med List Status: <None>   Medication Order Taking? Sig Documenting Provider Last Dose Status Informant  albuterol (PROVENTIL) (2.5 MG/3ML) 0.083% nebulizer solution [id number] Yes Take 3 mLs (2.5 mg total) by nebulization every 4 (four) hours as needed for wheezing or shortness of breath. Hetty Blend, FNP Taking Active   albuterol (VENTOLIN HFA) 108 (90 Base) MCG/ACT inhaler [id number] Yes Inhale 2 puffs into the lungs every 4 (four) hours as needed for wheezing or shortness of breath. Hetty Blend, FNP Taking Active   cephALEXin (KEFLEX) 500 MG capsule [id number] Yes Take 1 capsule (500 mg total) by mouth 4 (four) times daily. Darrick Grinder, PA-C Taking Active   Fe Fum-FePoly-Vit C-Vit B3 (INTEGRA) 62.5-62.5-40-3 MG CAPS [id number] Yes Take 1 capsule by mouth daily. [name], MD Taking Active   levonorgestrel (MIRENA) 20 MCG/DAY IUD [id number] Yes 1 each by Intrauterine route once. [provider] Taking Active   sertraline (ZOLOFT) 50 MG tablet [id number] Yes Take one tab po  qd Campbell Riches, NP Taking Active   traZODone (DESYREL) 50 MG tablet [id number] Yes Take 0.5-1 tablets (25-50 mg total) by mouth at bedtime as needed for sleep. Campbell Riches, NP Taking Active             Home Care and Equipment/Supplies: Were Home Health Services Ordered?: NA Any new equipment or medical supplies ordered?: NA  Functional Questionnaire: Do you need assistance with bathing/showering or dressing?: No Do you need assistance with meal preparation?: No Do you need assistance with eating?: No Do you have difficulty maintaining continence: No Do you need assistance with getting out of bed/getting out of a chair/moving?: No Do you have difficulty managing or taking your medications?: No  Follow up appointments reviewed: PCP Follow-up appointment confirmed?: NA Specialist Hospital Follow-up appointment confirmed?: NA Do you need transportation to your follow-up appointment?: No Do you understand care options if your condition(s) worsen?: Yes-patient verbalized understanding    SIGNATURE Karena Addison, LPN [facility] Nurse Health Advisor Direct Dial [phone]

## 2023-03-29 ENCOUNTER — Ambulatory Visit (INDEPENDENT_AMBULATORY_CARE_PROVIDER_SITE_OTHER): Payer: Commercial Managed Care - PPO

## 2023-03-29 DIAGNOSIS — J309 Allergic rhinitis, unspecified: Secondary | ICD-10-CM | POA: Diagnosis not present

## 2023-04-03 ENCOUNTER — Other Ambulatory Visit (HOSPITAL_COMMUNITY): Payer: Self-pay

## 2023-04-03 ENCOUNTER — Encounter (INDEPENDENT_AMBULATORY_CARE_PROVIDER_SITE_OTHER): Payer: Self-pay | Admitting: Family Medicine

## 2023-04-03 ENCOUNTER — Ambulatory Visit (INDEPENDENT_AMBULATORY_CARE_PROVIDER_SITE_OTHER): Payer: Commercial Managed Care - PPO | Admitting: Family Medicine

## 2023-04-03 VITALS — BP 98/58 | HR 57 | Temp 98.4°F | Ht 64.0 in | Wt 184.0 lb

## 2023-04-03 DIAGNOSIS — R718 Other abnormality of red blood cells: Secondary | ICD-10-CM

## 2023-04-03 DIAGNOSIS — E669 Obesity, unspecified: Secondary | ICD-10-CM | POA: Diagnosis not present

## 2023-04-03 DIAGNOSIS — E559 Vitamin D deficiency, unspecified: Secondary | ICD-10-CM | POA: Diagnosis not present

## 2023-04-03 DIAGNOSIS — E88819 Insulin resistance, unspecified: Secondary | ICD-10-CM | POA: Diagnosis not present

## 2023-04-03 DIAGNOSIS — Z6831 Body mass index (BMI) 31.0-31.9, adult: Secondary | ICD-10-CM

## 2023-04-03 MED ORDER — VITAMIN D (ERGOCALCIFEROL) 1.25 MG (50000 UNIT) PO CAPS
50000.0000 [IU] | ORAL_CAPSULE | ORAL | 0 refills | Status: DC
Start: 2023-04-03 — End: 2023-11-30
  Filled 2023-04-03: qty 4, 28d supply, fill #0

## 2023-04-03 NOTE — Progress Notes (Signed)
Chief Complaint:   OBESITY Vanessa [name] is here to discuss her progress with her obesity treatment plan along with follow-up of her obesity related diagnoses. Vanessa [name] is on the Category 2 Plan and states she is following her eating plan approximately 70% of the time. Vanessa [name] states she is doing 0 minutes 0 times per week.  Today's visit was #: 2 Starting weight: 186 lbs Starting date: 03/13/2023 Today's weight: 184 lbs Today's date: 04/03/2023 Total lbs lost to date: 2 Total lbs lost since last in-office visit: 2  Interim History: First couple of weeks weren't as bad as she anticipated.  She snacked quite a bit and enjoyed lots of the options.She felt the food quantities were different than what she is used to.  Lunch she only has 30 minutes between school and work so that makes it difficult to get the food in.  She substituted a protein iced coffee for breakfast ([employer] iced coffee- 110 calories and 20g protein).  Lunch she is doing fast food which as been difficult to get in the calorie and protein range.  She is often eating at [employer] or [employer].  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 She thinks life schedule wise will get easier to follow as she is graduating and won't be working either of the current two jobs that she has.  Recent diagnosis with pyelonephritis.  Subjective:   1. Microcytosis Vanessa [name]'s last RBC was elevated, H/H within normal limits, and MCV very low. She saw Hematology/Oncology, likely thalassemia.   2. Insulin resistance Vanessa [name]'s last A1c was 5.2 and insulin 20.4.She is not on medications, and she is still having carbohydrate cravings.   3. Vitamin D deficiency Vanessa [name] is not on prescription Vitamin D. Her recent Vitamin D level was 19.8, and she notes fatigue.   Assessment/Plan:   1. Microcytosis We will follow-up on repeat labs in July.   2. Insulin resistance Pathophysiology of insulin resistance, pre-diabetes,  and diabetes were discussed today.   3. Vitamin D deficiency Vanessa [name] agreed to start Vitamin D 50,000 IU once weekly with no refills.   - Vitamin D, Ergocalciferol, (DRISDOL) 1.25 MG (50000 UNIT) CAPS capsule; Take 1 capsule (50,000 Units total) by mouth every 7 (seven) days.  Dispense: 4 capsule; Refill: 0  4. BMI 31.0-31.9,adult  5. Obesity with starting BMI of 32.0 Vanessa [name] is currently in the action stage of change. As such, her goal is to continue with weight loss efforts. She has agreed to the Category 2 Plan.   Exercise goals: No exercise has been prescribed at this time.  Behavioral modification strategies: increasing lean protein intake, meal planning and cooking strategies, keeping healthy foods in the home, and planning for success.  Vanessa [name] has agreed to follow-up with our clinic in 2 weeks. She was informed of the importance of frequent follow-up visits to maximize her success with intensive lifestyle modifications for her multiple health conditions.   Objective:   Blood pressure (!) 98/58, pulse (!) 57, temperature 98.4 F (36.9 C), height 5\' 4"  (1.626 m), weight 184 lb (83.5 kg), last menstrual period 01/19/2023, SpO2 99 %. Body mass index is 31.58 kg/m.  General: Cooperative, alert, well developed, in no acute distress. HEENT: Conjunctivae and lids unremarkable. Cardiovascular: Regular rhythm.  Lungs: Normal work of breathing. Neurologic: No focal deficits.   Lab Results  Component Value Date   CREATININE 0.50 03/25/2023   BUN 13 03/25/2023   NA  137 03/25/2023   K 3.8 03/25/2023   CL 103 03/25/2023   CO2 25 03/25/2023   Lab Results  Component Value Date   ALT 13 03/25/2023   AST 13 (L) 03/25/2023   ALKPHOS 80 03/25/2023   BILITOT 0.7 03/25/2023   Lab Results  Component Value Date   HGBA1C 5.2 03/13/2023   Lab Results  Component Value Date   INSULIN 20.4 03/13/2023   Lab Results  Component Value Date   TSH 1.220 02/20/2023   Lab Results   Component Value Date   CHOL 152 03/13/2023   HDL 50 03/13/2023   LDLCALC 89 03/13/2023   TRIG 67 03/13/2023   CHOLHDL 2.8 01/17/2018   Lab Results  Component Value Date   VD25OH 19.8 (L) 03/13/2023   Lab Results  Component Value Date   WBC 15.2 (H) 03/25/2023   HGB 12.1 03/25/2023   HCT 38.5 03/25/2023   MCV 63.4 (L) 03/25/2023   PLT 327 03/25/2023   Lab Results  Component Value Date   IRON 76 03/13/2023   TIBC 392 03/13/2023   FERRITIN 45 03/13/2023   Attestation Statements:   Reviewed by clinician on day of visit: allergies, medications, problem list, medical history, surgical history, family history, social history, and previous encounter notes.  Time spent on visit including pre-visit chart review and post-visit care and charting was 45 minutes.   I, Burt Knack, am acting as transcriptionist for Reuben Likes, MD.  I have reviewed the above documentation for accuracy and completeness, and I agree with the above. - Reuben Likes, MD

## 2023-04-15 ENCOUNTER — Ambulatory Visit: Payer: Commercial Managed Care - PPO | Admitting: Plastic Surgery

## 2023-04-15 ENCOUNTER — Encounter: Payer: Self-pay | Admitting: Plastic Surgery

## 2023-04-15 VITALS — BP 108/74 | HR 57 | Ht 64.0 in | Wt 191.0 lb

## 2023-04-15 DIAGNOSIS — M542 Cervicalgia: Secondary | ICD-10-CM | POA: Diagnosis not present

## 2023-04-15 DIAGNOSIS — N62 Hypertrophy of breast: Secondary | ICD-10-CM | POA: Diagnosis not present

## 2023-04-15 DIAGNOSIS — Z6832 Body mass index (BMI) 32.0-32.9, adult: Secondary | ICD-10-CM

## 2023-04-15 DIAGNOSIS — M546 Pain in thoracic spine: Secondary | ICD-10-CM

## 2023-04-15 NOTE — Progress Notes (Signed)
Referring Provider Tommie Sams, DO [address] [name],  [place] [address]   CC:  Chief Complaint  Patient presents with   Advice Only      Vanessa [name] is an 17 y.o. female.  HPI: Vanessa [name] is an 17 year old female who presents today for evaluation of large breasts which are causing upper back and neck pain.  Patient states that she is unable to find bras that fit appropriately to relieve the upper back and neck pain.  She states it has been going on since she developed breast as a teenager.  She is interested in surgical reduction of the size of her breast.  She is accompanied by her mother today who has herself undergone a breast reduction.  The patient denies any other specific breast complaints.  Allergies  Allergen Reactions   Shellfish Allergy Shortness Of Breath    Outpatient Encounter Medications as of 04/15/2023  Medication Sig   albuterol (PROVENTIL) (2.5 MG/3ML) 0.083% nebulizer solution Take 3 mLs (2.5 mg total) by nebulization every 4 (four) hours as needed for wheezing or shortness of breath.   albuterol (VENTOLIN HFA) 108 (90 Base) MCG/ACT inhaler Inhale 2 puffs into the lungs every 4 (four) hours as needed for wheezing or shortness of breath.   Fe Fum-FePoly-Vit C-Vit B3 (INTEGRA) 62.5-62.5-40-3 MG CAPS Take 1 capsule by mouth daily.   levonorgestrel (MIRENA) 20 MCG/DAY IUD 1 each by Intrauterine route once.   sertraline (ZOLOFT) 50 MG tablet Take one tab po qd   traZODone (DESYREL) 50 MG tablet Take 0.5-1 tablets (25-50 mg total) by mouth at bedtime as needed for sleep.   Vitamin D, Ergocalciferol, (DRISDOL) 1.25 MG (50000 UNIT) CAPS capsule Take 1 capsule (50,000 Units total) by mouth every 7 (seven) days.   [DISCONTINUED] cephALEXin (KEFLEX) 500 MG capsule Take 1 capsule (500 mg total) by mouth 4 (four) times daily. (Patient not taking: Reported on 04/15/2023)   No facility-administered encounter medications on file as of 04/15/2023.     Past Medical  History:  Diagnosis Date   Anxiety    Asthma    Headache    Idiopathic urticaria 07/27/2022   Intrinsic atopic dermatitis 07/27/2022   Multiple food allergies    Obesity    Sleep disturbance     Past Surgical History:  Procedure Laterality Date   STRABISMUS SURGERY     TONSILLECTOMY      Family History  Problem Relation Age of Onset   Cancer Mother    Hyperlipidemia Mother    Hypertension Mother    Diabetes Mother    Migraines Mother    Obesity Mother    Hyperlipidemia Father    Hypertension Father    Headache Father    Allergic rhinitis Father    Sleep apnea Father    Alcoholism Father    Migraines Sister    Depression Sister    Anxiety disorder Sister    Heart attack Maternal Grandfather    Heart attack Paternal Grandfather    Angioedema Neg Hx    Asthma Neg Hx    Eczema Neg Hx    Immunodeficiency Neg Hx    Urticaria Neg Hx     Social History   Social History Narrative   Patient doing well in school.Living with her parents and older siblings. She enjoys dance, swimming, and tennis.      Review of Systems General: Denies fevers, chills, weight loss CV: Denies chest pain, shortness of breath, palpitations Breast: Large breasts  contributing to upper back and neck pain.  No history of breast biopsies or breast pathology.  Physical Exam    04/15/2023    1:07 PM 04/03/2023   10:10 AM 04/03/2023   10:00 AM  Vitals with BMI  Height 5\' 4"   5\' 4"   Weight 191 lbs  184 lbs  BMI 32.77  31.57  Systolic 108 98 94  Diastolic 74 58 55  Pulse 57  57    General:  No acute distress,  Alert and oriented, Non-Toxic, Normal speech and affect Breast: Patient has moderately large but very dense breasts.  She has grade 3 ptosis bilaterally.  Her sternal notch to nipple distance on the right is 30 cm and 31 cm on the left her fold to nipple distance on the right is 10 cm and 11 cm on the left. Mammogram: Not applicable due to age Assessment/Plan Macromastia: The patient  feels that her breasts are contributing to her upper back and neck pain.  I believe that I can remove 500 g per breast.  I have shared this with the patient as this is likely to be less the insurance company will require however in her case I do think that it would result through her symptoms.  I have discussed breast reduction at length with the patient.  I have shown her the location of the incisions and the unpredictable nature of scarring.  We discussed the risks of bleeding, infection, and seroma formation.  She understands I will use drains postoperatively.  We discussed the specific risk of nipple loss to nipple ischemia.  We also discussed the fact that approximately 9% of women who have a breast reduction being unsuccessful in breast-feeding postoperatively.  We discussed the postoperative restrictions including no heavy lifting greater than 20 pounds, no vigorous activity, and no submerging the incisions in water for 6 weeks.  The patient will have to wear a compressive garment for 6 weeks.  She may return to light activity as tolerated.  All questions were answered to her satisfaction.  Photographs were obtained today with her consent.  Will submit her for a breast reduction request.  Vanessa [name] 04/15/2023, 1:44 PM

## 2023-04-16 ENCOUNTER — Encounter (HOSPITAL_COMMUNITY): Payer: Self-pay | Admitting: Psychiatry

## 2023-04-16 ENCOUNTER — Telehealth: Payer: Self-pay | Admitting: Pulmonary Disease

## 2023-04-16 ENCOUNTER — Encounter (INDEPENDENT_AMBULATORY_CARE_PROVIDER_SITE_OTHER): Payer: Commercial Managed Care - PPO

## 2023-04-16 ENCOUNTER — Ambulatory Visit (INDEPENDENT_AMBULATORY_CARE_PROVIDER_SITE_OTHER): Payer: Commercial Managed Care - PPO | Admitting: Psychiatry

## 2023-04-16 DIAGNOSIS — F419 Anxiety disorder, unspecified: Secondary | ICD-10-CM | POA: Diagnosis not present

## 2023-04-16 DIAGNOSIS — R0683 Snoring: Secondary | ICD-10-CM | POA: Diagnosis not present

## 2023-04-16 DIAGNOSIS — G479 Sleep disorder, unspecified: Secondary | ICD-10-CM

## 2023-04-16 NOTE — Telephone Encounter (Signed)
HST did not show any evidence of sleep apnea. This is good news but it means we have to figure out what is causing her excessive daytime sleepiness. We have to investigate for narcolepsy. This will require an overnight sleep study at the sleep center followed by a nap testing/MSLT in the daytime.  She would to take the day off for this test. We had discussed this type of testing during office visit Okay to schedule in PSG & MSLT if she is willing to proceed

## 2023-04-17 ENCOUNTER — Ambulatory Visit (INDEPENDENT_AMBULATORY_CARE_PROVIDER_SITE_OTHER): Payer: Commercial Managed Care - PPO | Admitting: Family Medicine

## 2023-04-17 NOTE — Progress Notes (Signed)
IN-PERSON  Comprehensive Clinical Assessment (CCA) Note  04/17/2023 Vanessa [name] [id number]  Chief Complaint:  Chief Complaint  Patient presents with   Anxiety   Visit Diagnosis: Anxiety disorder unspecified    Patient Determined To Be At Risk for Harm To Self or Others Based on Review of Patient Reported Information or Presenting Complaint? No  Method: No Plan  Availability of Means: No access or NA  Intent: Vague intent or NA  Are There Guns or Other Weapons in Your Home? No  CCA Biopsychosocial Intake/Chief Complaint:  "My anxiety is really bad, I get this fluttering feeling in my stomach at night, I have thoughts of there is something I need to do, what if thoughts'  Current Symptoms/Problems: nervousness, worrying, anxious, easily irritated, muscle tension,   Patient Reported Schizophrenia/Schizoaffective Diagnosis in Past: No   Strengths: desire for improvment  Preferences: Individual therapy  Abilities: "I can't think of any"   Type of Services Patient Feels are Needed: Individual therapy/ find ways to calm me down better, need better ways to cope   Initial Clinical Notes/Concerns: Pt is referred forl services by PA Sherie Don due to pt experiencing stress and symptoms of anxiety. She denies any psychiatric hospitalizations. She reports no previous involvement in outpatient therapy.   Mental Health Symptoms Depression:   Change in energy/activity; Difficulty Concentrating; Fatigue; Irritability; Sleep (too much or little); Weight gain/loss   Duration of Depressive symptoms:  Greater than two weeks   Mania:   Change in energy/activity; Irritability   Anxiety:    Difficulty concentrating; Fatigue; Irritability; Sleep; Tension; Worrying   Psychosis:   None   Duration of Psychotic symptoms: No data recorded  Trauma:   None   Obsessions:   None   Compulsions:   None   Inattention:   None   Hyperactivity/Impulsivity:   None    Oppositional/Defiant Behaviors:   None   Emotional Irregularity:   None   Other Mood/Personality Symptoms:  No data recorded   Mental Status Exam Appearance and self-care  Stature:   Average   Weight:   Overweight   Clothing:   Casual   Grooming:   Normal   Cosmetic use:   Age appropriate   Posture/gait:   Normal   Motor activity:   Not Remarkable   Sensorium  Attention:   Normal   Concentration:   Normal   Orientation:   Time   Recall/memory:   Normal   Affect and Mood  Affect:   Anxious   Mood:   Anxious   Relating  Eye contact:   Normal   Facial expression:   Responsive   Attitude toward examiner:   Cooperative   Thought and Language  Speech flow:  Normal   Thought content:   Appropriate to Mood and Circumstances   Preoccupation:   Ruminations   Hallucinations:   None   Organization:  No data recorded  [employer] of Knowledge:   Good   Intelligence:   Average   Abstraction:   Normal   Judgement:   Good   Reality Testing:   Realistic   Insight:   Good   Decision Making:   Normal   Social Functioning  Social Maturity:   Responsible   Social Judgement:   Normal   Stress  Stressors:   Transitions (going to college in the fall, expenses associated with this)   Coping Ability:   Normal   Skill Deficits:  No data recorded  Supports:  Family; Friends/Service system     Religion: Religion/Spirituality Are You A Religious Person?: No  Leisure/Recreation: Leisure / Recreation Do You Have Hobbies?: Yes Leisure and Hobbies: watch sports particularly motor sports, going to movies, playing tennis, hanging out with friends.  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) What is the Longest Time Patient has Held a Job?: 2 years 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 [employer]  Education: Education Did Garment/textile technologist From [employer]?: Yes (Graudated from [employer]) Did Theme park manager?: Yes What Type of College Degree Do you Have?: Associates in science from [employer], will attend [employer] this fall Did You Have Any Scientist, research (life sciences) In School?: [employer], swimming, also played piano Did You Have An Individualized Education Program (IIEP): No Did You Have Any Difficulty At [employer]?: No Patient's Education Has Been Impacted by Current Illness: No   CCA Family/Childhood History Family and Relationship History: Family history Marital status: Single (Pt resides with mother in [place].) Are you sexually active?: No What is your sexual orientation?: straight Does patient have children?: No  Childhood History:  Childhood History By whom was/is the patient raised?: Both parents (Parents separated when pt was 32, pt then resided with mother but pt had regular contact with father.) Additional childhood history information: Pt was born and reared in [place], [place]. Description of patient's relationship with caregiver when they were a child: pretty good, always like a daddy's girl Patient's description of current relationship with people who raised him/her: still good How were you disciplined when you got in trouble as a child/adolescent?: I didn't get in trouble much, probably my mom yelling at me Does patient  have siblings?: Yes Number of Siblings: 3 Description of patient's current relationship with siblings: one sibling is deceased, get along fine with sister, talks to brother who lives in [place] about once per year Did patient suffer any verbal/emotional/physical/sexual abuse as a child?: No Did patient suffer from severe childhood neglect?: No Has patient ever been sexually abused/assaulted/raped as an adolescent or adult?: No Was the patient ever a victim of a crime or a disaster?: No Witnessed domestic violence?: No Has patient been affected by domestic violence as an adult?: No  Child/Adolescent Assessment:  None     CCA Substance Use Alcohol/Drug Use: Alcohol / Drug Use Pain Medications: see patient record Prescriptions: see patient record Over the Counter: see patient record History of alcohol / drug use?: No history of alcohol / drug abuse       ASAM's:  Six Dimensions of Multidimensional Assessment  Dimension 1:  Acute Intoxication and/or Withdrawal Potential:   Dimension 1:  Description of individual's past and current experiences of substance use and withdrawal: none  Dimension 2:  Biomedical Conditions and Complications:   Dimension 2:  Description of patient's biomedical conditions and  complications: none  Dimension 3:  Emotional, Behavioral, or Cognitive Conditions and Complications:  Dimension 3:  Description of  emotional, behavioral, or cognitive conditions and complications: none  Dimension 4:  Readiness to Change:  Dimension 4:  Description of Readiness to Change criteria: none  Dimension 5:  Relapse, Continued use, or Continued Problem Potential:  Dimension 5:  Relapse, continued use, or continued problem potential critiera description: none  Dimension 6:  Recovery/Living Environment:  Dimension 6:  Recovery/Iiving environment criteria description: nonoe  ASAM Severity Score: ASAM's Severity Rating Score: 0  ASAM Recommended Level of Treatment:     Substance  use Disorder (SUD) None  Recommendations for Services/Supports/Treatments: Recommendations for Services/Supports/Treatments Recommendations For Services/Supports/Treatments: Individual Therapy/patient attends the assessment appointment.  Confidentiality and limits were discussed.  Nutritional assessment, pain assessment, PHQ 2 and C-SSRS, and GAD-7 administered.  Individual therapy is recommended 1 time every 1 to 4 weeks to improve coping skills to manage stress and anxiety.  Patient agrees to return for an appointment in 3 to 4 weeks.  Patient will continue to see the PA Sherie Don for medication management.  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  Patient/Guardian was advised Release of Information must be obtained prior to any record release in order to collaborate  their care with an outside provider. Patient/Guardian was advised if they have not already done so to contact the registration department to sign all necessary forms in order for [place] to release information regarding their care.   Consent: Patient/Guardian gives verbal consent for treatment and assignment of benefits for services provided during this visit. Patient/Guardian expressed understanding and agreed to proceed.   Musette Kisamore E Taye Cato, LCSW

## 2023-04-18 ENCOUNTER — Ambulatory Visit (HOSPITAL_COMMUNITY): Payer: Commercial Managed Care - PPO | Admitting: Psychiatry

## 2023-04-19 NOTE — Telephone Encounter (Signed)
Called and spoke w/ pt she verbalized that she needs to discuss this with her mother and will call us back next week.

## 2023-04-26 ENCOUNTER — Ambulatory Visit (INDEPENDENT_AMBULATORY_CARE_PROVIDER_SITE_OTHER): Payer: Commercial Managed Care - PPO

## 2023-04-26 DIAGNOSIS — J309 Allergic rhinitis, unspecified: Secondary | ICD-10-CM | POA: Diagnosis not present

## 2023-05-17 ENCOUNTER — Ambulatory Visit (INDEPENDENT_AMBULATORY_CARE_PROVIDER_SITE_OTHER): Payer: Commercial Managed Care - PPO

## 2023-05-17 DIAGNOSIS — J309 Allergic rhinitis, unspecified: Secondary | ICD-10-CM | POA: Diagnosis not present

## 2023-05-21 ENCOUNTER — Telehealth: Payer: Self-pay

## 2023-05-21 NOTE — Telephone Encounter (Signed)
Patient's mother called to see when her daughter is supposed to have surgery or if we have heard anything from the insurance company regarding approval.  Advised patient I do not see anything regarding insurance besides her last office visit.  Please follow up.

## 2023-05-21 NOTE — Telephone Encounter (Signed)
I spoke with Vanessa [name]'s mother and updated her on the process for submitting cases for insurance pre-authorization. She is aware someone from our office will contact her after the case has been submitted and we have a response from the insurance company

## 2023-05-24 ENCOUNTER — Ambulatory Visit (INDEPENDENT_AMBULATORY_CARE_PROVIDER_SITE_OTHER): Payer: Commercial Managed Care - PPO

## 2023-05-24 DIAGNOSIS — J309 Allergic rhinitis, unspecified: Secondary | ICD-10-CM

## 2023-05-29 ENCOUNTER — Ambulatory Visit (INDEPENDENT_AMBULATORY_CARE_PROVIDER_SITE_OTHER): Payer: Commercial Managed Care - PPO

## 2023-05-29 DIAGNOSIS — J309 Allergic rhinitis, unspecified: Secondary | ICD-10-CM | POA: Diagnosis not present

## 2023-05-31 ENCOUNTER — Inpatient Hospital Stay: Payer: Commercial Managed Care - PPO

## 2023-06-03 ENCOUNTER — Inpatient Hospital Stay: Payer: Commercial Managed Care - PPO | Attending: Hematology

## 2023-06-03 DIAGNOSIS — D509 Iron deficiency anemia, unspecified: Secondary | ICD-10-CM | POA: Insufficient documentation

## 2023-06-03 DIAGNOSIS — D72829 Elevated white blood cell count, unspecified: Secondary | ICD-10-CM | POA: Diagnosis present

## 2023-06-03 DIAGNOSIS — R718 Other abnormality of red blood cells: Secondary | ICD-10-CM

## 2023-06-03 LAB — CBC WITH DIFFERENTIAL/PLATELET
Abs Immature Granulocytes: 0.04 10*3/uL (ref 0.00–0.07)
Basophils Absolute: 0.1 10*3/uL (ref 0.0–0.1)
Basophils Relative: 1 %
Eosinophils Absolute: 0.3 10*3/uL (ref 0.0–0.5)
Eosinophils Relative: 2 %
HCT: 34.6 % — ABNORMAL LOW (ref 36.0–46.0)
Hemoglobin: 10.8 g/dL — ABNORMAL LOW (ref 12.0–15.0)
Immature Granulocytes: 0 %
Lymphocytes Relative: 23 %
Lymphs Abs: 2.9 10*3/uL (ref 0.7–4.0)
MCH: 19.6 pg — ABNORMAL LOW (ref 26.0–34.0)
MCHC: 31.2 g/dL (ref 30.0–36.0)
MCV: 62.9 fL — ABNORMAL LOW (ref 80.0–100.0)
Monocytes Absolute: 0.8 10*3/uL (ref 0.1–1.0)
Monocytes Relative: 7 %
Neutro Abs: 8.2 10*3/uL — ABNORMAL HIGH (ref 1.7–7.7)
Neutrophils Relative %: 67 %
Platelets: 325 10*3/uL (ref 150–400)
RBC: 5.5 MIL/uL — ABNORMAL HIGH (ref 3.87–5.11)
RDW: 19.2 % — ABNORMAL HIGH (ref 11.5–15.5)
WBC: 12.2 10*3/uL — ABNORMAL HIGH (ref 4.0–10.5)
nRBC: 0 % (ref 0.0–0.2)

## 2023-06-03 LAB — RETICULOCYTES
Immature Retic Fract: 16.6 % — ABNORMAL HIGH (ref 2.3–15.9)
RBC.: 5.56 MIL/uL — ABNORMAL HIGH (ref 3.87–5.11)
Retic Count, Absolute: 97.3 10*3/uL (ref 19.0–186.0)
Retic Ct Pct: 1.8 % (ref 0.4–3.1)

## 2023-06-03 LAB — IRON AND TIBC
Iron: 57 ug/dL (ref 28–170)
Saturation Ratios: 15 % (ref 10.4–31.8)
TIBC: 389 ug/dL (ref 250–450)
UIBC: 332 ug/dL

## 2023-06-03 LAB — FERRITIN: Ferritin: 31 ng/mL (ref 11–307)

## 2023-06-03 LAB — LACTATE DEHYDROGENASE: LDH: 105 U/L (ref 98–192)

## 2023-06-05 ENCOUNTER — Ambulatory Visit: Payer: Self-pay

## 2023-06-05 DIAGNOSIS — J309 Allergic rhinitis, unspecified: Secondary | ICD-10-CM | POA: Diagnosis not present

## 2023-06-05 LAB — METHYLMALONIC ACID, SERUM: Methylmalonic Acid, Quantitative: 115 nmol/L (ref 0–378)

## 2023-06-05 LAB — COPPER, SERUM: Copper: 135 ug/dL (ref 71–146)

## 2023-06-06 LAB — HGB FRACTIONATION CASCADE
Hgb A2: 2.7 % (ref 1.8–3.2)
Hgb A: 93.3 % — ABNORMAL LOW (ref 96.4–98.8)
Hgb F: 4 % — ABNORMAL HIGH (ref 0.0–2.0)
Hgb S: 0 %

## 2023-06-07 ENCOUNTER — Telehealth: Payer: Self-pay | Admitting: *Deleted

## 2023-06-07 LAB — BCR-ABL1 FISH
Cells Analyzed: 200
Cells Counted: 200

## 2023-06-07 NOTE — Telephone Encounter (Signed)
Spoke with patient, notified her of denial. Ins requires 1000g and Dr. Is est 500g.

## 2023-06-10 LAB — JAK2 V617F RFX CALR/MPL/E12-15

## 2023-06-10 LAB — CALR +MPL + E12-E15  (REFLEX)

## 2023-06-13 ENCOUNTER — Ambulatory Visit (HOSPITAL_COMMUNITY): Payer: Commercial Managed Care - PPO | Admitting: Psychiatry

## 2023-06-16 NOTE — Progress Notes (Signed)
[facility] [address] [place] [address]   Clinic Day:  06/16/2023  Referring physician: Tommie Sams, DO  Patient Care Team: Tommie Sams, DO as PCP - General (Family Medicine)   ASSESSMENT & PLAN:   Assessment:  1.  RBC microcytosis and leukocytosis: - Seen at the request of Baruch Merl, NP - CBC (02/20/2023): Hb-10.7, MCV-64. - 03/13/2023: Ferritin-45, percent saturation 19, folate-6, B12-239 - Integra (FeSO4) started on 03/15/2023 - History of menorrhagia and dysmenorrhea, Mirena IUD placed, no bleeding since February - Complains of fatigue, mostly in the middle of the day since February 2024  2.  Social/family history: - Mother has microcytosis, maternal grandmother also has microcytosis.  No family history of leukemia or malignancies.  No family history of known thalassemia or sickle cell anemia.  Plan:  1.  RBC microcytosis: - She has slightly low iron levels.  She started Integra tablet on 03/15/2023 and tolerated well so far.  She will continue the Integra tablet daily.  She will use stool softener if needed. - She also has borderline B12 levels.  She was told to take B12 1 mg tablet daily. - She has severe microcytosis in the past even with normal hemoglobin.  Suspect she has thalassemia trait. - I plan to see her back in 3 months.  I plan to check her CBC, ferritin, iron panel.  Will also check methylmalonic acid and copper levels. - I will check hemoglobin electrophoresis prior to next visit.  2.  Leukocytosis, predominantly neutrophils: - She has neutrophilic leukocytosis since 2019.  No B symptoms.  No prior history of splenectomy. - Will check for MPN including JAK2 V617F and BCR/ABL at next visit.  No orders of the defined types were placed in this encounter.     Alben Deeds Teague,acting as a Neurosurgeon for [name], MD.,have documented all relevant documentation on the behalf of [name], MD,as directed by   [name], MD while in the presence of [name], MD.  ***   [place] R Teague   7/21/20244:17 PM  CHIEF COMPLAINT/PURPOSE OF CONSULT:   Diagnosis: RBC microcytosis and leukocytosis  Current Therapy: Iron tablet and B12 tablet daily  HISTORY OF PRESENT ILLNESS:   Vanessa [name] is a 17 y.o. female presenting to clinic today for evaluation of anemia at the request of Sherie Don, NP.  Today, she states that she is doing well overall. Her appetite level is at 100%. Her energy level is at 40%.  She was found to have abnormal CBC from 02/20/23, at which time her WBC was 13.7 and hemoglobin 10.7. This was followed up by an anemia panel on 03/13/23, and results were all WNL-- iron 76, ferritin 45. She denies recent chest pain on exertion, shortness of breath on minimal exertion, pre-syncopal episodes, or palpitations. She had not noticed any recent bleeding such as epistaxis, hematuria or hematochezia The patient denies over the counter NSAID ingestion. She is not  on antiplatelets agents. She had no prior history or diagnosis of cancer. She denies any family history of cancer.  She denies any pica and eats a variety of diet. She never donated blood or received blood transfusion.  INTERVAL  HISTORY:   [name] is a 17 y.o. female presenting to the clinic today for follow-up of RBC microcytosis and leukocytosis. She was last seen by me on 03/18/23.  Since her last visit, she presented to the ED on 4/29 for right flank pain. Pertinent results include  WBC 15.2, normal creatinine, negative pregnancy test, UA with moderate hemoglobin, small leukocytes, and rare bacteria. Imaging showed no signs of renal stones or other acute abnormalities. She was discharged home on antibiotics on 4/30.   Today, she states that she is doing well overall. Her appetite level is at ***%. Her energy level is at ***%.  PAST MEDICAL HISTORY:   Past Medical History: Past Medical History:   Diagnosis Date   Anxiety    Asthma    Headache    Idiopathic urticaria 07/27/2022   Intrinsic atopic dermatitis 07/27/2022   Multiple food allergies    Obesity    Sleep disturbance     Surgical History: Past Surgical History:  Procedure Laterality Date   STRABISMUS SURGERY     TONSILLECTOMY      Social History: Social History   Socioeconomic History   Marital status: Single    Spouse name: Not on file   Number of children: Not on file   Years of education: Not on file   Highest education level: Not on file  Occupational History   Occupation: Consulting civil engineer, Work 2 jobs  Tobacco Use   Smoking status: Never   Smokeless tobacco: Never  Vaping Use   Vaping status: Never Used  Substance and Sexual Activity   Alcohol use: No   Drug use: No   Sexual activity: Never  Other Topics Concern   Not on file  Social History Narrative   Patient doing well in school.Living with her parents and older siblings. She enjoys dance, swimming, and tennis.    Social Determinants of Health   Financial Resource Strain: Low Risk  (10/26/2022)   Overall Financial Resource Strain (CARDIA)    Difficulty of Paying Living Expenses: Not hard at all  Food Insecurity: No Food Insecurity (03/18/2023)   Hunger Vital Sign    Worried About Running Out of Food in the Last Year: Never true    Ran Out of Food in the Last Year: Never true  Transportation Needs: No Transportation Needs (03/18/2023)   PRAPARE - Administrator, Civil Service (Medical): No    Lack of Transportation (Non-Medical): No  Physical Activity: Inactive (10/26/2022)   Exercise Vital Sign    Days of Exercise per Week: 0 days    Minutes of Exercise per Session: 0 min  Stress: No Stress Concern Present (10/26/2022)   [employer] of Occupational Health - Occupational Stress Questionnaire    Feeling of Stress : Only a little  Social Connections: Moderately Integrated (10/26/2022)   Social Connection and Isolation Panel  [NHANES]    Frequency of Communication with Friends and Family: More than three times a week    Frequency of Social Gatherings with Friends and Family: More than three times a week    Attends Religious Services: 1 to 4 times per year    Active Member of [employer] or Organizations: Yes    Attends Banker Meetings: More than 4 times per year    Marital Status: Never married  Intimate Partner Violence: Not At Risk (03/18/2023)   Humiliation, Afraid, Rape, and Kick questionnaire    Fear of Current or Ex-Partner: No    Emotionally Abused: No    Physically Abused: No    Sexually Abused: No    Family History: Family History  Problem Relation Age of Onset   Depression Mother    Anxiety disorder Mother    Cancer Mother    Hyperlipidemia Mother    Hypertension Mother  Diabetes Mother    Migraines Mother    Obesity Mother    Hyperlipidemia Father    Hypertension Father    Headache Father    Allergic rhinitis Father    Sleep apnea Father    Alcoholism Father    Migraines Sister    Depression Sister    Anxiety disorder Sister    Heart attack Maternal Grandfather    Heart attack Paternal Grandfather    Angioedema Neg Hx    Asthma Neg Hx    Eczema Neg Hx    Immunodeficiency Neg Hx    Urticaria Neg Hx     Current Medications:  Current Outpatient Medications:    albuterol (PROVENTIL) (2.5 MG/3ML) 0.083% nebulizer solution, Take 3 mLs (2.5 mg total) by nebulization every 4 (four) hours as needed for wheezing or shortness of breath., Disp: 75 mL, Rfl: 1   albuterol (VENTOLIN HFA) 108 (90 Base) MCG/ACT inhaler, Inhale 2 puffs into the lungs every 4 (four) hours as needed for wheezing or shortness of breath., Disp: 13.4 g, Rfl: 1   Fe Fum-FePoly-Vit C-Vit B3 (INTEGRA) 62.5-62.5-40-3 MG CAPS, Take 1 capsule by mouth daily., Disp: 30 capsule, Rfl: 6   levonorgestrel (MIRENA) 20 MCG/DAY IUD, 1 each by Intrauterine route once., Disp: , Rfl:    sertraline (ZOLOFT) 50 MG tablet,  Take one tab po qd, Disp: 90 tablet, Rfl: 0   traZODone (DESYREL) 50 MG tablet, Take 0.5-1 tablets (25-50 mg total) by mouth at bedtime as needed for sleep., Disp: 90 tablet, Rfl: 0   Vitamin D, Ergocalciferol, (DRISDOL) 1.25 MG (50000 UNIT) CAPS capsule, Take 1 capsule (50,000 Units total) by mouth every 7 (seven) days., Disp: 4 capsule, Rfl: 0   Allergies: Allergies  Allergen Reactions   Shellfish Allergy Shortness Of Breath    REVIEW OF SYSTEMS:   Review of Systems  Constitutional:  Negative for chills, fatigue and fever.  HENT:   Negative for lump/mass, mouth sores, nosebleeds, sore throat and trouble swallowing.   Eyes:  Negative for eye problems.  Respiratory:  Negative for cough and shortness of breath.   Cardiovascular:  Negative for chest pain, leg swelling and palpitations.  Gastrointestinal:  Negative for abdominal pain, constipation, diarrhea, nausea and vomiting.  Genitourinary:  Negative for bladder incontinence, difficulty urinating, dysuria, frequency, hematuria and nocturia.   Musculoskeletal:  Negative for arthralgias, back pain, flank pain, myalgias and neck pain.  Skin:  Negative for itching and rash.  Neurological:  Negative for dizziness, headaches and numbness.  Hematological:  Does not bruise/bleed easily.  Psychiatric/Behavioral:  Negative for depression, sleep disturbance and suicidal ideas. The patient is not nervous/anxious.   All other systems reviewed and are negative.    VITALS:   There were no vitals taken for this visit.  Wt Readings from Last 3 Encounters:  04/15/23 191 lb (86.6 kg) (97%, Z= 1.84)*  04/03/23 184 lb (83.5 kg) (96%, Z= 1.73)*  03/25/23 188 lb (85.3 kg) (96%, Z= 1.79)*   * Growth percentiles are based on CDC (Girls, 2-20 Years) data.    There is no height or weight on file to calculate BMI.   PHYSICAL EXAM:   Physical Exam Vitals and nursing note reviewed. Exam conducted with a chaperone present.  Constitutional:       Appearance: Normal appearance.  Cardiovascular:     Rate and Rhythm: Normal rate and regular rhythm.     Pulses: Normal pulses.     Heart sounds: Normal heart sounds.  Pulmonary:  Effort: Pulmonary effort is normal.     Breath sounds: Normal breath sounds.  Abdominal:     Palpations: Abdomen is soft. There is no hepatomegaly, splenomegaly or mass.     Tenderness: There is no abdominal tenderness.  Musculoskeletal:     Right lower leg: No edema.     Left lower leg: No edema.  Lymphadenopathy:     Cervical: No cervical adenopathy.     Right cervical: No superficial, deep or posterior cervical adenopathy.    Left cervical: No superficial, deep or posterior cervical adenopathy.     Upper Body:     Right upper body: No supraclavicular or axillary adenopathy.     Left upper body: No supraclavicular or axillary adenopathy.  Neurological:     General: No focal deficit present.     Mental Status: She is alert and oriented to person, place, and time.  Psychiatric:        Mood and Affect: Mood normal.        Behavior: Behavior normal.     LABS:      Latest Ref Rng & Units 06/03/2023    2:41 PM 03/25/2023    8:54 PM 03/13/2023    9:05 AM  CBC  WBC 4.0 - 10.5 K/uL 12.2  15.2    Hemoglobin 12.0 - 15.0 g/dL 01.6  01.0    Hematocrit 36.0 - 46.0 % 34.6  38.5  38.1   Platelets 150 - 400 K/uL 325  327        Latest Ref Rng & Units 03/25/2023    8:54 PM 02/20/2023    3:23 PM 07/29/2020    4:23 PM  CMP  Glucose 70 - 99 mg/dL 932  92  83   BUN 6 - 20 mg/dL 13  8  8    Creatinine 0.44 - 1.00 mg/dL 3.55  7.32  2.02   Sodium 135 - 145 mmol/L 137  139  137   Potassium 3.5 - 5.1 mmol/L 3.8  4.0  4.3   Chloride 98 - 111 mmol/L 103  102  101   CO2 22 - 32 mmol/L 25  21  21    Calcium 8.9 - 10.3 mg/dL 9.1  9.8  9.9   Total Protein 6.5 - 8.1 g/dL 8.2  6.8  7.4   Total Bilirubin 0.3 - 1.2 mg/dL 0.7  0.5  0.4   Alkaline Phos 38 - 126 U/L 80  92  109   AST 15 - 41 U/L 13  10  9    ALT 0 - 44 U/L  13  12  7       No results found for: "CEA1", "CEA" / No results found for: "CEA1", "CEA" No results found for: "PSA1" No results found for: "RKY706" No results found for: "CAN125"  No results found for: "TOTALPROTELP", "ALBUMINELP", "A1GS", "A2GS", "BETS", "BETA2SER", "GAMS", "MSPIKE", "SPEI" Lab Results  Component Value Date   TIBC 389 06/03/2023   TIBC 392 03/13/2023   TIBC 389 07/29/2020   FERRITIN 31 06/03/2023   FERRITIN 45 03/13/2023   FERRITIN 33 07/29/2020   IRONPCTSAT 15 06/03/2023   IRONPCTSAT 19 03/13/2023   IRONPCTSAT 12 (L) 07/29/2020   Lab Results  Component Value Date   LDH 105 06/03/2023     STUDIES:   No results found.

## 2023-06-17 ENCOUNTER — Inpatient Hospital Stay: Payer: Commercial Managed Care - PPO | Admitting: Hematology

## 2023-06-17 VITALS — BP 114/69 | HR 60 | Temp 99.6°F | Resp 16 | Wt 191.8 lb

## 2023-06-17 DIAGNOSIS — D509 Iron deficiency anemia, unspecified: Secondary | ICD-10-CM

## 2023-06-17 DIAGNOSIS — D72829 Elevated white blood cell count, unspecified: Secondary | ICD-10-CM | POA: Diagnosis not present

## 2023-06-17 NOTE — Patient Instructions (Signed)
[facility]  Discharge Instructions  You were seen and examined today by Dr. Ellin Saba.  Dr. Ellin Saba discussed your most recent lab work which revealed that everything looks good except your iron is low.  Dr. Ellin Saba is recommending you receive IV iron.  Follow-up as scheduled in 4 months.    Thank you for choosing [facility] to provide your oncology and hematology care.   To afford each patient quality time with our provider, please arrive at least 15 minutes before your scheduled appointment time. You may need to reschedule your appointment if you arrive late (10 or more minutes). Arriving late affects you and other patients whose appointments are after yours.  Also, if you miss three or more appointments without notifying the office, you may be dismissed from the clinic at the provider's discretion.    Again, thank you for choosing [facility].  Our hope is that these requests will decrease the amount of time that you wait before being seen by our physicians.   If you have a lab appointment with the Cancer Center - please note that after April 8th, all labs will be drawn in the cancer center.  You do not have to check in or register with the main entrance as you have in the past but will complete your check-in at the cancer center.            _____________________________________________________________  Should you have questions after your visit to [facility], please contact our office at [phone] and follow the prompts.  Our office hours are 8:00 a.m. to 4:30 p.m. Monday - Thursday and 8:00 a.m. to 2:30 p.m. Friday.  Please note that voicemails left after 4:00 p.m. may not be returned until the following business day.  We are closed weekends and all major holidays.  You do have access to a nurse 24-7, just call the main number to the clinic [phone] and do not press any options, hold on the  line and a nurse will answer the phone.    For prescription refill requests, have your pharmacy contact our office and allow 72 hours.    Masks are no longer required in the cancer centers. If you would like for your care team to wear a mask while they are taking care of you, please let them know. You may have one support person who is at least 19 years old accompany you for your appointments.

## 2023-06-26 ENCOUNTER — Ambulatory Visit (HOSPITAL_COMMUNITY): Payer: Commercial Managed Care - PPO | Admitting: Psychiatry

## 2023-06-27 DIAGNOSIS — J3089 Other allergic rhinitis: Secondary | ICD-10-CM | POA: Diagnosis not present

## 2023-06-27 NOTE — Progress Notes (Signed)
VIALS EXP 06-26-24

## 2023-07-02 NOTE — Progress Notes (Signed)
This encounter was created in error - please disregard.

## 2023-07-03 ENCOUNTER — Ambulatory Visit (HOSPITAL_COMMUNITY): Payer: Commercial Managed Care - PPO | Admitting: Psychiatry

## 2023-07-08 ENCOUNTER — Inpatient Hospital Stay: Payer: Commercial Managed Care - PPO | Attending: Hematology

## 2023-07-08 VITALS — BP 115/57 | HR 51 | Temp 98.4°F | Resp 18

## 2023-07-08 DIAGNOSIS — D509 Iron deficiency anemia, unspecified: Secondary | ICD-10-CM | POA: Diagnosis not present

## 2023-07-08 MED ORDER — FAMOTIDINE IN NACL 20-0.9 MG/50ML-% IV SOLN
20.0000 mg | Freq: Once | INTRAVENOUS | Status: AC
  Administered 2023-07-08: 20 mg via INTRAVENOUS
  Filled 2023-07-08: qty 50

## 2023-07-08 MED ORDER — CETIRIZINE HCL 10 MG/ML IV SOLN
10.0000 mg | Freq: Once | INTRAVENOUS | Status: AC
  Administered 2023-07-08: 10 mg via INTRAVENOUS
  Filled 2023-07-08: qty 1

## 2023-07-08 MED ORDER — METHYLPREDNISOLONE SODIUM SUCC 125 MG IJ SOLR
125.0000 mg | Freq: Once | INTRAMUSCULAR | Status: AC
  Administered 2023-07-08: 125 mg via INTRAVENOUS
  Filled 2023-07-08: qty 2

## 2023-07-08 MED ORDER — ACETAMINOPHEN 325 MG PO TABS
650.0000 mg | ORAL_TABLET | Freq: Once | ORAL | Status: AC
  Administered 2023-07-08: 650 mg via ORAL
  Filled 2023-07-08: qty 2

## 2023-07-08 MED ORDER — SODIUM CHLORIDE 0.9 % IV SOLN
95.0000 mg | Freq: Once | INTRAVENOUS | Status: DC
  Filled 2023-07-08: qty 1.9

## 2023-07-08 MED ORDER — SODIUM CHLORIDE 0.9 % IV SOLN: Freq: Once | INTRAVENOUS | Status: AC

## 2023-07-08 MED ORDER — SODIUM CHLORIDE 0.9 % IV SOLN
950.0000 mg | Freq: Once | INTRAVENOUS | Status: AC
  Administered 2023-07-08: 950 mg via INTRAVENOUS
  Filled 2023-07-08: qty 19

## 2023-07-08 MED ORDER — SODIUM CHLORIDE 0.9 % IV SOLN
50.0000 mg | Freq: Once | INTRAVENOUS | Status: AC
  Administered 2023-07-08: 50 mg via INTRAVENOUS
  Filled 2023-07-08: qty 1

## 2023-07-08 NOTE — Progress Notes (Signed)
Infed infusion given per orders. Patient tolerated it well without problems. Vitals stable and discharged home from clinic ambulatory. Follow up as scheduled.

## 2023-07-08 NOTE — Patient Instructions (Signed)
[facility]  Discharge Instructions: Thank you for choosing [facility] to provide your oncology and hematology care.  If you have a lab appointment with the Cancer Center - please note that after April 8th, 2024, all labs will be drawn in the cancer center.  You do not have to check in or register with the main entrance as you have in the past but will complete your check-in in the cancer center.  Wear comfortable clothing and clothing appropriate for easy access to any Portacath or PICC line.   We strive to give you quality time with your provider. You may need to reschedule your appointment if you arrive late (15 or more minutes).  Arriving late affects you and other patients whose appointments are after yours.  Also, if you miss three or more appointments without notifying the office, you may be dismissed from the clinic at the provider's discretion.      For prescription refill requests, have your pharmacy contact our office and allow 72 hours for refills to be completed.    Today you received the following iron infusion, Infed   To help prevent nausea and vomiting after your treatment, we encourage you to take your nausea medication as directed.  BELOW ARE SYMPTOMS THAT SHOULD BE REPORTED IMMEDIATELY: *FEVER GREATER THAN 100.4 F (38 C) OR HIGHER *CHILLS OR SWEATING *NAUSEA AND VOMITING THAT IS NOT CONTROLLED WITH YOUR NAUSEA MEDICATION *UNUSUAL SHORTNESS OF BREATH *UNUSUAL BRUISING OR BLEEDING *URINARY PROBLEMS (pain or burning when urinating, or frequent urination) *BOWEL PROBLEMS (unusual diarrhea, constipation, pain near the anus) TENDERNESS IN MOUTH AND THROAT WITH OR WITHOUT PRESENCE OF ULCERS (sore throat, sores in mouth, or a toothache) UNUSUAL RASH, SWELLING OR PAIN  UNUSUAL VAGINAL DISCHARGE OR ITCHING   Items with * indicate a potential emergency and should be followed up as soon as possible or go to the Emergency Department if any  problems should occur.  Please show the CHEMOTHERAPY ALERT CARD or IMMUNOTHERAPY ALERT CARD at check-in to the Emergency Department and triage nurse.  Should you have questions after your visit or need to cancel or reschedule your appointment, please contact [facility] [phone]  and follow the prompts.  Office hours are 8:00 a.m. to 4:30 p.m. Monday - Friday. Please note that voicemails left after 4:00 p.m. may not be returned until the following business day.  We are closed weekends and major holidays. You have access to a nurse at all times for urgent questions. Please call the main number to the clinic [phone] and follow the prompts.  For any non-urgent questions, you may also contact your provider using MyChart. We now offer e-Visits for anyone 39 and older to request care online for non-urgent symptoms. For details visit [email or web address].   Also download the MyChart app! Go to the app store, search "MyChart", open the app, select [place], and log in with your MyChart username and password.

## 2023-08-19 ENCOUNTER — Other Ambulatory Visit (HOSPITAL_COMMUNITY): Payer: Self-pay

## 2023-08-19 ENCOUNTER — Other Ambulatory Visit: Payer: Self-pay | Admitting: Family Medicine

## 2023-08-19 ENCOUNTER — Encounter: Payer: Self-pay | Admitting: Hematology

## 2023-08-19 MED ORDER — ALBUTEROL SULFATE HFA 108 (90 BASE) MCG/ACT IN AERS
2.0000 | INHALATION_SPRAY | RESPIRATORY_TRACT | 0 refills | Status: DC | PRN
  Filled 2023-08-19: qty 13.4, 34d supply, fill #0

## 2023-08-19 MED ORDER — ALBUTEROL SULFATE (2.5 MG/3ML) 0.083% IN NEBU
2.5000 mg | INHALATION_SOLUTION | RESPIRATORY_TRACT | 0 refills | Status: DC | PRN
  Filled 2023-08-19: qty 75, 5d supply, fill #0

## 2023-08-22 ENCOUNTER — Telehealth: Payer: Self-pay

## 2023-08-22 NOTE — Telephone Encounter (Signed)
Called and notified patient that we needed the vial transfer sheet completed from [employer]. Dr. Dellis Anes signed the papers and I faxed them to [facility] at [phone]. I informed patient that when the papers are completed and faxed back to [place] we could schedule her an appointment to start her injections back but she'd need to come into the office to get her first injection and take them to the school. Patint verbalized understanding.

## 2023-08-23 ENCOUNTER — Ambulatory Visit: Payer: Commercial Managed Care - PPO

## 2023-08-28 NOTE — Telephone Encounter (Addendum)
[facility] sent over a the signed forms to have vials transferred to [facility]. I called and spoke to patient and she scheduled her appointment for her first injection and take her vials with her. I have completed paperwork and sent it to [facility] where she will pick up her vials.     Where should restart her vials at as she has not had an injection since 06/05/2023 and received 0.5 of her red vial doing Q4 weeks on schedule C.

## 2023-08-29 NOTE — Telephone Encounter (Signed)
Let's start at 0.1 mL and advance on Schedule C.

## 2023-09-02 ENCOUNTER — Ambulatory Visit (INDEPENDENT_AMBULATORY_CARE_PROVIDER_SITE_OTHER): Payer: Commercial Managed Care - PPO

## 2023-09-02 DIAGNOSIS — J309 Allergic rhinitis, unspecified: Secondary | ICD-10-CM

## 2023-09-02 NOTE — Progress Notes (Signed)
Immunotherapy   Patient Details  Name: Vanessa [name] MRN: [id number] Date of Birth: [birth year]  09/02/2023  [name] here to pick up  G-W-T-C-D and DM-Molds-CR-RW vials. Patient received 0.1 ml of both red vials with an expiration of 10/26/2023. Patient was also sent with new Red vials to start once old ones expire.  Following schedule: C  Frequency:1 time per week then @ 0.5 ml every 4 weeks Epi-Pen:Epi-Pen Available  Consent signed and patient instructions given. Patient will get her allergy injections at [facility] [place].   [name] 09/02/2023, 8:40 AM

## 2023-09-16 DIAGNOSIS — Z516 Encounter for desensitization to allergens: Secondary | ICD-10-CM | POA: Diagnosis not present

## 2023-09-25 DIAGNOSIS — Z516 Encounter for desensitization to allergens: Secondary | ICD-10-CM | POA: Diagnosis not present

## 2023-10-08 DIAGNOSIS — Z516 Encounter for desensitization to allergens: Secondary | ICD-10-CM | POA: Diagnosis not present

## 2023-10-15 DIAGNOSIS — Z516 Encounter for desensitization to allergens: Secondary | ICD-10-CM | POA: Diagnosis not present

## 2023-10-28 ENCOUNTER — Other Ambulatory Visit (HOSPITAL_COMMUNITY): Payer: Self-pay

## 2023-10-28 ENCOUNTER — Other Ambulatory Visit: Payer: Self-pay | Admitting: Family Medicine

## 2023-10-28 ENCOUNTER — Encounter: Payer: Self-pay | Admitting: Family Medicine

## 2023-10-28 DIAGNOSIS — Z516 Encounter for desensitization to allergens: Secondary | ICD-10-CM | POA: Diagnosis not present

## 2023-10-29 ENCOUNTER — Encounter (HOSPITAL_COMMUNITY): Payer: Self-pay

## 2023-10-29 ENCOUNTER — Other Ambulatory Visit (HOSPITAL_COMMUNITY): Payer: Self-pay

## 2023-11-04 DIAGNOSIS — Z516 Encounter for desensitization to allergens: Secondary | ICD-10-CM | POA: Diagnosis not present

## 2023-11-08 ENCOUNTER — Inpatient Hospital Stay: Payer: Commercial Managed Care - PPO | Attending: Hematology

## 2023-11-08 DIAGNOSIS — D72828 Other elevated white blood cell count: Secondary | ICD-10-CM | POA: Diagnosis not present

## 2023-11-08 DIAGNOSIS — D509 Iron deficiency anemia, unspecified: Secondary | ICD-10-CM | POA: Insufficient documentation

## 2023-11-08 LAB — CBC
HCT: 36.8 % (ref 36.0–46.0)
Hemoglobin: 11.4 g/dL — ABNORMAL LOW (ref 12.0–15.0)
MCH: 20.2 pg — ABNORMAL LOW (ref 26.0–34.0)
MCHC: 31 g/dL (ref 30.0–36.0)
MCV: 65.4 fL — ABNORMAL LOW (ref 80.0–100.0)
Platelets: 307 10*3/uL (ref 150–400)
RBC: 5.63 MIL/uL — ABNORMAL HIGH (ref 3.87–5.11)
RDW: 19.4 % — ABNORMAL HIGH (ref 11.5–15.5)
WBC: 12 10*3/uL — ABNORMAL HIGH (ref 4.0–10.5)
nRBC: 0 % (ref 0.0–0.2)

## 2023-11-08 LAB — IRON AND TIBC
Iron: 83 ug/dL (ref 28–170)
Saturation Ratios: 22 % (ref 10.4–31.8)
TIBC: 371 ug/dL (ref 250–450)
UIBC: 288 ug/dL

## 2023-11-08 LAB — FERRITIN: Ferritin: 180 ng/mL (ref 11–307)

## 2023-11-13 ENCOUNTER — Other Ambulatory Visit (HOSPITAL_COMMUNITY): Payer: Self-pay

## 2023-11-13 MED ORDER — CHLORHEXIDINE GLUCONATE 0.12 % MT SOLN
Freq: Two times a day (BID) | OROMUCOSAL | 0 refills | Status: DC
  Filled 2023-11-13: qty 473, 16d supply, fill #0

## 2023-11-14 NOTE — Progress Notes (Signed)
[facility] [address], [place] [address]   CLINIC:  Medical Oncology/Hematology  PCP:  Tommie Sams, DO [address] [place] [address] [phone]   REASON FOR VISIT:  Follow-up for leukocytosis and microcytic anemia  PRIOR THERAPY: None  CURRENT THERAPY: IV iron as needed  INTERVAL HISTORY:   [name] 17 y.o. female returns for routine follow-up of her leukocytosis and microcytic anemia.  At today's visit, she reports feeling fairly well.  No recent hospitalizations, surgeries, or changes in baseline health status.  She reports feeling improved energy after IV iron.  She denies any pica, headaches, lightheadedness, syncope, chest pain, or dyspnea on exertion.  She has not had menstrual period since IUD placed in February 2024.  She denies any rectal bleeding or melena.  She reports sinus infection and steroid injection in November 2024.  Otherwise, no frequent infections or steroid use.  No history of chronic inflammatory disease.  She does not smoke or vape.  She denies any B symptoms, masses, or lymphadenopathy.  She has 90% energy and 100% appetite. She endorses that she is maintaining a stable weight.   ASSESSMENT & PLAN:  1.  RBC microcytosis + iron deficiency anemia - Seen at the request of Baruch Merl, NP due to CBC from 02/20/2023 showing hemoglobin 10.7 with MCV 64.  Ferritin was 45 with iron saturation 19%, folate 6, and vitamin B12 239.  (Further workup showed normal MMA, copper, reticulocytes, LDH) - Patient mother and maternal grandmother also have microcytosis, but no family history of known thalassemia or sickle cell anemia - Hemoglobin electrophoresis (06/03/2023) showed elevated Hgb F (4.0) and decreased Hgb A (93.3).  This may indicate presence of hereditary persistence gene, but can also be seen in other clinical settings. - History of menorrhagia and dysmenorrhea, Mirena IUD placed, no bleeding since February  - No  rectal bleeding or melena  - Integra (FeSO4) started on 03/15/2023, but unable to tolerate due to gastric upset  - Received IV iron with INFeD 1000 mg on 07/08/2023 (PREMEDICATED with steroid, H1 and H2 blockers due to history of severe shellfish allergy). - Energy improved after IV iron.   - Most recent labs (11/08/2023): Hgb improved at 11.4 but with persistent microcytosis (MCV 65.4).  Ferritin 180, iron saturation 22%. - PLAN:  Repeat hemoglobin electrophoresis cascade TODAY , now that iron has been replenished.   - Recommend daily multivitamin due to marginal B12 and folate levels from March 2024. - Repeat labs in 6 months, to include CBC/D, ferritin, iron/TIBC, B12, MMA, folate.  2.  Leukocytosis, predominantly neutrophils: - She has neutrophilic leukocytosis since 2019. - No history of splenectomy.  No history of chronic inflammatory disease.  She does not smoke or vape. - No recurrent infections or chronic steroid use. - We reviewed MPN testing which showed negative mutations for JAK2 V617F, CALR, MPL, Exons 12-15 and BCR/ABL. - No B symptoms, masses, or lymphadenopathy. - Most recent CBC with stable mild leukocytosis WBC 12.0 (no differential) - PLAN: Suspect reactive leukocytosis in the setting of obesity or other occult pro-inflammatory state.  Continue surveillance.  3.  Social/family history: - Mother has microcytosis, maternal grandmother also has microcytosis.  No family history of leukemia or malignancies.  No family history of known thalassemia or sickle cell anemia.    PLAN SUMMARY: >> Labs today = hemoglobin fractionation cascade >> Labs in 6 months = CBC/D, ferritin, iron/TIBC,, B12, MMA, folate, ESR, CRP, ANA, rheumatoid  factor >> OFFICE visit in 6 months (1 week after labs)     REVIEW OF SYSTEMS:   Review of Systems  Constitutional:  Negative for appetite change, chills, diaphoresis, fatigue, fever and unexpected weight change.  HENT:   Negative for lump/mass and  nosebleeds.   Eyes:  Negative for eye problems.  Respiratory:  Negative for cough, hemoptysis and shortness of breath.   Cardiovascular:  Negative for chest pain, leg swelling and palpitations.  Gastrointestinal:  Negative for abdominal pain, blood in stool, constipation, diarrhea, nausea and vomiting.  Genitourinary:  Negative for hematuria.   Skin: Negative.   Neurological:  Negative for dizziness, headaches and light-headedness.  Hematological:  Does not bruise/bleed easily.     PHYSICAL EXAM:  ECOG PERFORMANCE STATUS: 0 - Asymptomatic  Vitals:   11/15/23 0859  BP: (!) 104/47  Pulse: (!) 53  Resp: 17  Temp: 99.3 F (37.4 C)  SpO2: 98%   Filed Weights   11/15/23 0859  Weight: 196 lb 6.4 oz (89.1 kg)   Physical Exam Constitutional:      Appearance: Normal appearance. She is obese.  Cardiovascular:     Heart sounds: Normal heart sounds.  Pulmonary:     Breath sounds: Normal breath sounds.  Lymphadenopathy:     Cervical: No cervical adenopathy.  Neurological:     General: No focal deficit present.     Mental Status: Mental status is at baseline.  Psychiatric:        Behavior: Behavior normal. Behavior is cooperative.     PAST MEDICAL/SURGICAL HISTORY:  Past Medical History:  Diagnosis Date   Anxiety    Asthma    Headache    Idiopathic urticaria 07/27/2022   Intrinsic atopic dermatitis 07/27/2022   Multiple food allergies    Obesity    Sleep disturbance    Past Surgical History:  Procedure Laterality Date   STRABISMUS SURGERY     TONSILLECTOMY      SOCIAL HISTORY:  Social History   Socioeconomic History   Marital status: Single    Spouse name: Not on file   Number of children: Not on file   Years of education: Not on file   Highest education level: Not on file  Occupational History   Occupation: Consulting civil engineer, Work 2 jobs  Tobacco Use   Smoking status: Never   Smokeless tobacco: Never  Vaping Use   Vaping status: Never Used  Substance and Sexual  Activity   Alcohol use: No   Drug use: No   Sexual activity: Never  Other Topics Concern   Not on file  Social History Narrative   Patient doing well in school.Living with her parents and older siblings. She enjoys dance, swimming, and tennis.    Social Drivers of Corporate investment banker Strain: Low Risk  (10/26/2022)   Overall Financial Resource Strain (CARDIA)    Difficulty of Paying Living Expenses: Not hard at all  Food Insecurity: No Food Insecurity (03/18/2023)   Hunger Vital Sign    Worried About Running Out of Food in the Last Year: Never true    Ran Out of Food in the Last Year: Never true  Transportation Needs: No Transportation Needs (03/18/2023)   PRAPARE - Administrator, Civil Service (Medical): No    Lack of Transportation (Non-Medical): No  Physical Activity: Inactive (10/26/2022)   Exercise Vital Sign    Days of Exercise per Week: 0 days    Minutes of Exercise per Session: 0 min  Stress: No Stress Concern Present (10/26/2022)   [employer] of Occupational Health - Occupational Stress Questionnaire    Feeling of Stress : Only a little  Social Connections: Moderately Integrated (10/26/2022)   Social Connection and Isolation Panel [NHANES]    Frequency of Communication with Friends and Family: More than three times a week    Frequency of Social Gatherings with Friends and Family: More than three times a week    Attends Religious Services: 1 to 4 times per year    Active Member of [employer] or Organizations: Yes    Attends Engineer, structural: More than 4 times per year    Marital Status: Never married  Intimate Partner Violence: Not At Risk (03/18/2023)   Humiliation, Afraid, Rape, and Kick questionnaire    Fear of Current or Ex-Partner: No    Emotionally Abused: No    Physically Abused: No    Sexually Abused: No    FAMILY HISTORY:  Family History  Problem Relation Age of Onset   Depression Mother    Anxiety disorder Mother    Cancer  Mother    Hyperlipidemia Mother    Hypertension Mother    Diabetes Mother    Migraines Mother    Obesity Mother    Hyperlipidemia Father    Hypertension Father    Headache Father    Allergic rhinitis Father    Sleep apnea Father    Alcoholism Father    Migraines Sister    Depression Sister    Anxiety disorder Sister    Heart attack Maternal Grandfather    Heart attack Paternal Grandfather    Angioedema Neg Hx    Asthma Neg Hx    Eczema Neg Hx    Immunodeficiency Neg Hx    Urticaria Neg Hx     CURRENT MEDICATIONS:  Outpatient Encounter Medications as of 11/15/2023  Medication Sig   albuterol (PROVENTIL) (2.5 MG/3ML) 0.083% nebulizer solution Take 3 mLs (2.5 mg total) by nebulization every 4 (four) hours as needed for wheezing or shortness of breath.   albuterol (VENTOLIN HFA) 108 (90 Base) MCG/ACT inhaler Inhale 2 puffs into the lungs every 4 (four) hours as needed for wheezing or shortness of breath.   chlorhexidine (PERIDEX) 0.12 % solution Swish for 30 seconds then spit 2 (two) times daily.   Fe Fum-FePoly-Vit C-Vit B3 (INTEGRA) 62.5-62.5-40-3 MG CAPS Take 1 capsule by mouth daily.   levonorgestrel (MIRENA) 20 MCG/DAY IUD 1 each by Intrauterine route once.   sertraline (ZOLOFT) 50 MG tablet Take one tab po qd   traZODone (DESYREL) 50 MG tablet Take 0.5-1 tablets (25-50 mg total) by mouth at bedtime as needed for sleep.   Vitamin D, Ergocalciferol, (DRISDOL) 1.25 MG (50000 UNIT) CAPS capsule Take 1 capsule (50,000 Units total) by mouth every 7 (seven) days.   No facility-administered encounter medications on file as of 11/15/2023.    ALLERGIES:  Allergies  Allergen Reactions   Shellfish Allergy Shortness Of Breath    LABORATORY DATA:  I have reviewed the labs as listed.  CBC    Component Value Date/Time   WBC 12.0 (H) 11/08/2023 0914   RBC 5.63 (H) 11/08/2023 0914   HGB 11.4 (L) 11/08/2023 0914   HGB 10.7 (L) 02/20/2023 1523   HCT 36.8 11/08/2023 0914   HCT  38.1 03/13/2023 0905   PLT 307 11/08/2023 0914   PLT 339 02/20/2023 1523   MCV 65.4 (L) 11/08/2023 0914   MCV 64 (L) 02/20/2023 1523  MCH 20.2 (L) 11/08/2023 0914   MCHC 31.0 11/08/2023 0914   RDW 19.4 (H) 11/08/2023 0914   RDW 19.8 (H) 02/20/2023 1523   LYMPHSABS 2.9 06/03/2023 1441   LYMPHSABS 2.8 02/20/2023 1523   MONOABS 0.8 06/03/2023 1441   EOSABS 0.3 06/03/2023 1441   EOSABS 0.3 02/20/2023 1523   BASOSABS 0.1 06/03/2023 1441   BASOSABS 0.1 02/20/2023 1523      Latest Ref Rng & Units 03/25/2023    8:54 PM 02/20/2023    3:23 PM 07/29/2020    4:23 PM  CMP  Glucose 70 - 99 mg/dL 409  92  83   BUN 6 - 20 mg/dL 13  8  8    Creatinine 0.44 - 1.00 mg/dL 8.11  9.14  7.82   Sodium 135 - 145 mmol/L 137  139  137   Potassium 3.5 - 5.1 mmol/L 3.8  4.0  4.3   Chloride 98 - 111 mmol/L 103  102  101   CO2 22 - 32 mmol/L 25  21  21    Calcium 8.9 - 10.3 mg/dL 9.1  9.8  9.9   Total Protein 6.5 - 8.1 g/dL 8.2  6.8  7.4   Total Bilirubin 0.3 - 1.2 mg/dL 0.7  0.5  0.4   Alkaline Phos 38 - 126 U/L 80  92  109   AST 15 - 41 U/L 13  10  9    ALT 0 - 44 U/L 13  12  7      DIAGNOSTIC IMAGING:  I have independently reviewed the relevant imaging and discussed with the patient.   WRAP UP:  All questions were answered. The patient knows to call the clinic with any problems, questions or concerns.  Medical decision making: Moderate  Time spent on visit: I spent 20 minutes counseling the patient face to face. The total time spent in the appointment was 30 minutes and more than 50% was on counseling.  Carnella Guadalajara, PA-C  11/15/23 9:39 AM

## 2023-11-15 ENCOUNTER — Inpatient Hospital Stay: Payer: Commercial Managed Care - PPO

## 2023-11-15 ENCOUNTER — Inpatient Hospital Stay (HOSPITAL_BASED_OUTPATIENT_CLINIC_OR_DEPARTMENT_OTHER): Payer: Commercial Managed Care - PPO | Admitting: Physician Assistant

## 2023-11-15 ENCOUNTER — Ambulatory Visit (INDEPENDENT_AMBULATORY_CARE_PROVIDER_SITE_OTHER): Payer: Commercial Managed Care - PPO

## 2023-11-15 VITALS — BP 104/47 | HR 53 | Temp 99.3°F | Resp 17 | Wt 196.4 lb

## 2023-11-15 DIAGNOSIS — D72825 Bandemia: Secondary | ICD-10-CM

## 2023-11-15 DIAGNOSIS — D72828 Other elevated white blood cell count: Secondary | ICD-10-CM | POA: Diagnosis not present

## 2023-11-15 DIAGNOSIS — R718 Other abnormality of red blood cells: Secondary | ICD-10-CM

## 2023-11-15 DIAGNOSIS — J309 Allergic rhinitis, unspecified: Secondary | ICD-10-CM | POA: Diagnosis not present

## 2023-11-15 DIAGNOSIS — D509 Iron deficiency anemia, unspecified: Secondary | ICD-10-CM | POA: Diagnosis not present

## 2023-11-15 NOTE — Patient Instructions (Addendum)
[facility] **VISIT SUMMARY & IMPORTANT INSTRUCTIONS **   You were seen today by Rojelio Brenner PA-C for your follow-up visit.    ANEMIA & MICROCYTOSIS (small red blood cells) Your blood counts have improved after your IV iron.  You do not need any additional IV iron at this time. Your red blood cells remain very small (low MCV).  This is most likely due to a benign inherited genetic trait that causes abnormal hemoglobin. We will check some additional test for this today and discuss results at your follow-up in 6 months.  ELEVATED WHITE BLOOD CELLS Your elevated white blood cells appear to be "reactive."  This means that they are most likely related to underlying inflammation, which may or may not be related to elevated BMI. There is no evidence of any cancerous cause of your high white blood cells at this time, but we will continue to monitor them.  OTHER: Start taking daily multivitamin due to borderline low folic acid and B12 levels.  FOLLOW-UP APPOINTMENT: Labs and office visit in 6 months  ** Thank you for trusting me with your healthcare!  I strive to provide all of my patients with quality care at each visit.  If you receive a survey for this visit, I would be so grateful to you for taking the time to provide feedback.  Thank you in advance!  ~ Kendell Sagraves                   Dr. [name]   &   Rojelio Brenner, PA-C   - - - - - - - - - - - - - - - - - -    Thank you for choosing [facility] at [facility] to provide your oncology and hematology care.  To afford each patient quality time with our provider, please arrive at least 15 minutes before your scheduled appointment time.   If you have a lab appointment with the Cancer Center please come in thru the Main Entrance and check in at the main information desk.  You need to re-schedule your appointment should you arrive 10 or more minutes late.  We strive to  give you quality time with our providers, and arriving late affects you and other patients whose appointments are after yours.  Also, if you no show three or more times for appointments you may be dismissed from the clinic at the providers discretion.     Again, thank you for choosing [facility].  Our hope is that these requests will decrease the amount of time that you wait before being seen by our physicians.       _____________________________________________________________  Should you have questions after your visit to [facility], please contact our office at [phone] and follow the prompts.  Our office hours are 8:00 a.m. and 4:30 p.m. Monday - Friday.  Please note that voicemails left after 4:00 p.m. may not be returned until the following business day.  We are closed weekends and major holidays.  You do have access to a nurse 24-7, just call the main number to the clinic [phone] and do not press any options, hold on the line and a nurse will answer the phone.    For prescription refill requests, have your pharmacy contact our office and allow 72 hours.

## 2023-11-19 ENCOUNTER — Other Ambulatory Visit (HOSPITAL_COMMUNITY): Payer: Self-pay

## 2023-11-19 ENCOUNTER — Telehealth: Payer: Commercial Managed Care - PPO | Admitting: Physician Assistant

## 2023-11-19 DIAGNOSIS — J4520 Mild intermittent asthma, uncomplicated: Secondary | ICD-10-CM

## 2023-11-19 DIAGNOSIS — J209 Acute bronchitis, unspecified: Secondary | ICD-10-CM | POA: Diagnosis not present

## 2023-11-19 MED ORDER — BENZONATATE 100 MG PO CAPS
100.0000 mg | ORAL_CAPSULE | Freq: Three times a day (TID) | ORAL | 0 refills | Status: DC | PRN
  Filled 2023-11-19: qty 30, 10d supply, fill #0

## 2023-11-19 MED ORDER — ALBUTEROL SULFATE HFA 108 (90 BASE) MCG/ACT IN AERS
2.0000 | INHALATION_SPRAY | RESPIRATORY_TRACT | 0 refills | Status: DC | PRN
  Filled 2023-11-19: qty 13.4, 34d supply, fill #0

## 2023-11-19 MED ORDER — AZITHROMYCIN 250 MG PO TABS
ORAL_TABLET | ORAL | 0 refills | Status: AC
  Filled 2023-11-19: qty 6, 5d supply, fill #0

## 2023-11-19 NOTE — Progress Notes (Signed)
I have spent 5 minutes in review of e-visit questionnaire, review and updating patient chart, medical decision making and response to patient.   Mia Milan Cody Jacklynn Dehaas, PA-C    

## 2023-11-19 NOTE — Progress Notes (Signed)
E-Visit for Cough   We are sorry that you are not feeling well.  Here is how we plan to help!  Based on your presentation I believe you most likely have A cough due to bacteria.  When patients have a productive cough with a change in color or increased sputum production, we are concerned about bacterial bronchitis.  If left untreated it can progress to pneumonia.  If your symptoms do not improve with your treatment plan it is important that you contact your provider.   I have prescribed Azithromyin 250 mg: two tablets now and then one tablet daily for 4 additonal days    In addition you may use A prescription cough medication called Tessalon Perles 100mg . You may take 1-2 capsules every 8 hours as needed for your cough.  I have also sent in an albuterol inhaler to use as directed.   From your responses in the eVisit questionnaire you describe inflammation in the upper respiratory tract which is causing a significant cough.  This is commonly called Bronchitis and has four common causes:   Allergies Viral Infections Acid Reflux Bacterial Infection Allergies, viruses and acid reflux are treated by controlling symptoms or eliminating the cause. An example might be a cough caused by taking certain blood pressure medications. You stop the cough by changing the medication. Another example might be a cough caused by acid reflux. Controlling the reflux helps control the cough.  USE OF BRONCHODILATOR ("RESCUE") INHALERS: There is a risk from using your bronchodilator too frequently.  The risk is that over-reliance on a medication which only relaxes the muscles surrounding the breathing tubes can reduce the effectiveness of medications prescribed to reduce swelling and congestion of the tubes themselves.  Although you feel brief relief from the bronchodilator inhaler, your asthma may actually be worsening with the tubes becoming more swollen and filled with mucus.  This can delay other crucial treatments,  such as oral steroid medications. If you need to use a bronchodilator inhaler daily, several times per day, you should discuss this with your provider.  There are probably better treatments that could be used to keep your asthma under control.     HOME CARE Only take medications as instructed by your medical team. Complete the entire course of an antibiotic. Drink plenty of fluids and get plenty of rest. Avoid close contacts especially the very young and the elderly Cover your mouth if you cough or cough into your sleeve. Always remember to wash your hands A steam or ultrasonic humidifier can help congestion.   GET HELP RIGHT AWAY IF: You develop worsening fever. You become short of breath You cough up blood. Your symptoms persist after you have completed your treatment plan MAKE SURE YOU  Understand these instructions. Will watch your condition. Will get help right away if you are not doing well or get worse.    Thank you for choosing an e-visit.  Your e-visit answers were reviewed by a board certified advanced clinical practitioner to complete your personal care plan. Depending upon the condition, your plan could have included both over the counter or prescription medications.  Please review your pharmacy choice. Make sure the pharmacy is open so you can pick up prescription now. If there is a problem, you may contact your provider through [employer] and have the prescription routed to another pharmacy.  Your safety is important to [place]. If you have drug allergies check your prescription carefully.   For the next 24 hours you can  use MyChart to ask questions about today's visit, request a non-urgent call back, or ask for a work or school excuse. You will get an email in the next two days asking about your experience. I hope that your e-visit has been valuable and will speed your recovery.

## 2023-11-20 LAB — HGB FRACTIONATION CASCADE
Hgb A2: 2.6 % (ref 1.8–3.2)
Hgb A: 92.5 % — ABNORMAL LOW (ref 96.4–98.8)
Hgb F: 4.9 % — ABNORMAL HIGH (ref 0.0–2.0)
Hgb S: 0 %

## 2023-11-25 ENCOUNTER — Encounter: Payer: Commercial Managed Care - PPO | Admitting: Family Medicine

## 2023-11-26 ENCOUNTER — Encounter: Payer: Self-pay | Admitting: Physician Assistant

## 2023-11-26 ENCOUNTER — Other Ambulatory Visit (HOSPITAL_COMMUNITY): Payer: Self-pay

## 2023-11-26 ENCOUNTER — Ambulatory Visit (INDEPENDENT_AMBULATORY_CARE_PROVIDER_SITE_OTHER): Payer: Commercial Managed Care - PPO | Admitting: Physician Assistant

## 2023-11-26 VITALS — BP 112/72 | Ht 64.0 in | Wt 193.0 lb

## 2023-11-26 DIAGNOSIS — Z0001 Encounter for general adult medical examination with abnormal findings: Secondary | ICD-10-CM

## 2023-11-26 DIAGNOSIS — Z0000 Encounter for general adult medical examination without abnormal findings: Secondary | ICD-10-CM

## 2023-11-26 DIAGNOSIS — R058 Other specified cough: Secondary | ICD-10-CM

## 2023-11-26 MED ORDER — PROMETHAZINE-DM 6.25-15 MG/5ML PO SYRP
5.0000 mL | ORAL_SOLUTION | Freq: Four times a day (QID) | ORAL | 0 refills | Status: DC | PRN
  Filled 2023-11-26: qty 118, 6d supply, fill #0

## 2023-11-26 NOTE — Progress Notes (Signed)
 Established Patient Office Visit  Subjective   Patient ID: Vanessa [name], female    DOB: 09-04-2006  Age: 17 y.o. MRN: [id number]  Chief Complaint  Patient presents with   Annual Exam    Persistent cough after recent illness    Patient is a 17 year old female who presents for annual wellness visit today.  She is currently enrolled in college.  She reports a decently balanced diet.  She reports walking daily and once a week cycle classes for exercise.  She does not smoke or vape. She is up-to-date on preventative care, at this time she is not of age for preventative cervical cancer screening.  Patient does report recent diagnosis of bronchitis.  She states she is experiencing a lingering cough.  She currently has Tessalon  Perles for symptoms however experiences minimal relief.     Review of Systems  Constitutional:  Negative for chills, fever and weight loss.  HENT: Negative.    Eyes: Negative.   Respiratory:  Positive for cough. Negative for shortness of breath and wheezing.   Cardiovascular:  Negative for chest pain and palpitations.  Gastrointestinal:  Negative for abdominal pain and nausea.  Genitourinary: Negative.   Psychiatric/Behavioral:  Negative for depression.       Objective:     BP 112/72   Ht 5' 4 (1.626 m)   Wt 193 lb (87.5 kg)   BMI 33.13 kg/m    Physical Exam Constitutional:      Appearance: Normal appearance. She is obese.  HENT:     Head: Normocephalic.     Nose: Nose normal. No congestion or rhinorrhea.     Mouth/Throat:     Mouth: Mucous membranes are moist.     Pharynx: Oropharynx is clear.  Eyes:     Extraocular Movements: Extraocular movements intact.     Conjunctiva/sclera: Conjunctivae normal.  Neck:     Thyroid : No thyroid  mass, thyromegaly or thyroid  tenderness.  Cardiovascular:     Rate and Rhythm: Normal rate and regular rhythm.     Heart sounds: No murmur heard.    No gallop.  Pulmonary:     Effort: Pulmonary effort is normal.      Breath sounds: Normal breath sounds. No wheezing, rhonchi or rales.  Abdominal:     General: Abdomen is flat. Bowel sounds are normal.     Palpations: Abdomen is soft.     Tenderness: There is no abdominal tenderness.  Musculoskeletal:     Cervical back: Normal range of motion and neck supple.  Lymphadenopathy:     Cervical: No cervical adenopathy.  Skin:    General: Skin is warm and dry.     Capillary Refill: Capillary refill takes less than 2 seconds.  Neurological:     General: No focal deficit present.     Mental Status: She is alert and oriented to person, place, and time.  Psychiatric:        Mood and Affect: Mood normal.        Behavior: Behavior normal.      No results found for any visits on 11/26/23.  The ASCVD Risk score (Arnett DK, et al., 2019) failed to calculate for the following reasons:   The 2019 ASCVD risk score is only valid for ages 38 to 39    Assessment & Plan:   Return in about 1 year (around 11/25/2024) for well visit .   Encounter for well woman exam without gynecological exam  Post-viral cough syndrome -  Promethazine -DM; Take 5 mLs by mouth 4 (four) times daily as needed for cough.  Dispense: 118 mL; Refill: 0   Adult wellness- complete wellness physical was conducted today. Importance of diet and exercise were discussed in detail.  Importance of stress reduction and healthy living were discussed.  In addition to this a discussion regarding safety was also covered.  We also reviewed over immunizations and gave recommendations regarding current immunization needed for age.   In addition to this additional areas were also touched on including: cough  - Promethazine  DM ordered   Preventative health exams up to date. Not at age for Pap test  Patient was advised yearly wellness exam   Charmaine Ashok Sawaya, PA-C

## 2023-11-30 ENCOUNTER — Ambulatory Visit
Admission: EM | Admit: 2023-11-30 | Discharge: 2023-11-30 | Disposition: A | Discharge status: Home / Self Care | Payer: Commercial Managed Care - PPO | Attending: Nurse Practitioner | Admitting: Nurse Practitioner

## 2023-11-30 ENCOUNTER — Encounter: Payer: Self-pay | Admitting: Emergency Medicine

## 2023-11-30 DIAGNOSIS — J209 Acute bronchitis, unspecified: Secondary | ICD-10-CM | POA: Diagnosis not present

## 2023-11-30 MED ORDER — ALBUTEROL SULFATE HFA 108 (90 BASE) MCG/ACT IN AERS: 2.0000 | INHALATION_SPRAY | Freq: Four times a day (QID) | RESPIRATORY_TRACT | 0 refills | Status: DC | PRN

## 2023-11-30 MED ORDER — PREDNISONE 20 MG PO TABS: 40.0000 mg | ORAL_TABLET | Freq: Every day | ORAL | 0 refills | Status: AC

## 2023-11-30 NOTE — Discharge Instructions (Signed)
 Take medication as prescribed. Increase fluids and allow for plenty of rest. Recommend Tylenol  or ibuprofen  as needed for pain, fever, or general discomfort. Recommend using a humidifier at bedtime during sleep to help with cough and nasal congestion. Also sleeping elevated will be helpful while your symptoms persist. As discussed, the cough may linger for several weeks. If you are feeling well, but continue to have a persistent cough, drinks fluids and continue OTC cough medication. Follow-up if you develop wheezing, shortness of breath or difficulty breathing. Follow-up as needed.  Follow-up if your symptoms do not improve.

## 2023-11-30 NOTE — ED Provider Notes (Signed)
 [facility]    CSN: [id number] Arrival date & time: 11/30/23  0955      History   Chief Complaint No chief complaint on file.   HPI Vanessa [name] is a 18 y.o. female.   The history is provided by the patient.   Patient presents for complaints of cough and wheezing that is been present for the past month.  Patient states symptoms started during the second week of December.  She completed an e-visit and was prescribed azithromycin  and Tessalon , and later saw her PCP earlier this week and was prescribed Promethazine  DM.  Patient states that she feels like she has trouble taking in a deep breath and catching her breath, states that she sounds like she is wheezing..  Patient denies fever, chills, nasal congestion, runny nose, difficulty breathing, chest pain, abdominal pain, nausea, vomiting, diarrhea, or rash.  States that she currently has a prescription for an albuterol  inhaler.  Past Medical History:  Diagnosis Date   Anxiety    Asthma    Headache    Idiopathic urticaria 07/27/2022   Intrinsic atopic dermatitis 07/27/2022   Multiple food allergies    Obesity    Sleep disturbance     Patient Active Problem List   Diagnosis Date Noted   RBC microcytosis 03/18/2023   Leukocytosis 03/18/2023   Fatigue 02/15/2023   Snoring 02/15/2023   Anxiety 12/28/2022   Sleep disturbance 12/28/2022   Obesity (BMI 30-39.9) 11/08/2022   Menorrhagia with regular cycle 08/11/2022   Dysmenorrhea 08/11/2022   Intrinsic atopic dermatitis 07/27/2022   Idiopathic urticaria 07/27/2022   Acanthosis nigricans 07/29/2020   Iron  deficiency anemia 07/29/2020   Mild intermittent asthma without complication 07/02/2018   Allergy with anaphylaxis due to food, 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 Authorizing Provider  predniSONE  (DELTASONE ) 20 MG tablet Take 2 tablets (40 mg total) by mouth daily with breakfast for 5 days. 11/30/23 12/05/23 Yes Leath-Warren, Etta PARAS, NP  benzonatate  (TESSALON ) 100 MG capsule Take 1 capsule (100 mg total) by mouth 3 (three) times daily as needed for cough. 11/19/23   Gladis Elsie BROCKS, PA-C  levonorgestrel  (MIRENA ) 20 MCG/DAY IUD 1 each by Intrauterine route once.    [provider]  promethazine -dextromethorphan (PROMETHAZINE -DM) 6.25-15 MG/5ML syrup Take 5 mLs by mouth 4 (four) times daily as needed for cough. 11/26/23   Grooms, Courtney, PA-C  sertraline  (ZOLOFT ) 50 MG tablet Take one tab po qd 02/15/23   Hoskins, Carolyn C, NP  traZODone  (DESYREL ) 50 MG tablet Take 0.5-1 tablets (25-50 mg total) by mouth at bedtime as needed for sleep. 02/15/23   Mauro Elveria BROCKS, NP    Family History Family History  Problem Relation Age of Onset   Depression Mother    Anxiety disorder Mother  Cancer Mother    Hyperlipidemia Mother    Hypertension Mother    Diabetes Mother    Migraines Mother    Obesity Mother    Hyperlipidemia Father    Hypertension Father    Headache Father    Allergic rhinitis Father    Sleep apnea Father    Alcoholism Father    Migraines Sister    Depression Sister    Anxiety disorder Sister    Heart attack Maternal Grandfather    Heart attack Paternal Grandfather    Angioedema Neg Hx    Asthma Neg Hx    Eczema Neg Hx    Immunodeficiency Neg Hx    Urticaria Neg Hx     Social History Social History   Tobacco Use   Smoking status: Never   Smokeless tobacco: Never  Vaping Use   Vaping status: Never Used  Substance Use Topics   Alcohol use: No   Drug use: No     Allergies    Shellfish allergy   Review of Systems Review of Systems Per HPI  Physical Exam Triage Vital Signs ED Triage Vitals  Encounter Vitals Group     BP 11/30/23 1001 137/85     Systolic BP Percentile --      Diastolic BP Percentile --      Pulse Rate 11/30/23 1001 92     Resp 11/30/23 1001 18     Temp 11/30/23 1001 98.9 F (37.2 C)     Temp Source 11/30/23 1001 Oral     SpO2 11/30/23 1001 98 %     Weight --      Height --      Head Circumference --      Peak Flow --      Pain Score 11/30/23 1002 0     Pain Loc --      Pain Education --      Exclude from Growth Chart --    No data found.  Updated Vital Signs BP 137/85 (BP Location: Right Arm)   Pulse 92   Temp 98.9 F (37.2 C) (Oral)   Resp 18   SpO2 98%   Visual Acuity Right Eye Distance:   Left Eye Distance:   Bilateral Distance:    Right Eye Near:   Left Eye Near:    Bilateral Near:     Physical Exam Vitals and nursing note reviewed.  Constitutional:      General: She is not in acute distress.    Appearance: Normal appearance.  HENT:     Head: Normocephalic.     Right Ear: Tympanic membrane, ear canal and external ear normal.     Left Ear: Tympanic membrane, ear canal and external ear normal.     Nose: Nose normal.     Mouth/Throat:     Mouth: Mucous membranes are moist.  Eyes:     Extraocular Movements: Extraocular movements intact.     Pupils: Pupils are equal, round, and reactive to light.  Cardiovascular:     Rate and Rhythm: Normal rate and regular rhythm.     Pulses: Normal pulses.     Heart sounds: Normal heart sounds.  Pulmonary:     Effort: Pulmonary effort is normal. No respiratory distress.     Breath sounds: Normal breath sounds. No stridor. No wheezing, rhonchi or rales.  Abdominal:     General: Bowel sounds are normal.     Palpations: Abdomen is soft.     Tenderness: There is no  abdominal tenderness.  Musculoskeletal:     Cervical back: Normal range of motion.  Lymphadenopathy:      Cervical: No cervical adenopathy.  Skin:    General: Skin is warm and dry.  Neurological:     General: No focal deficit present.     Mental Status: She is alert and oriented to person, place, and time.  Psychiatric:        Mood and Affect: Mood normal.        Behavior: Behavior normal.      UC Treatments / Results  Labs (all labs ordered are listed, but only abnormal results are displayed) Labs Reviewed - No data to display  EKG   Radiology No results found.  Procedures Procedures (including critical care time)  Medications Ordered in UC Medications - No data to display  Initial Impression / Assessment and Plan / UC Course  I have reviewed the triage vital signs and the nursing notes.  Pertinent labs & imaging results that were available during my care of the patient were reviewed by me and considered in my medical decision making (see chart for details).  On exam, lung sounds are clear throughout, room air sats at 98%.  No wheezing, rhonchi, or rales present.  Do suspect patient has acute bronchitis, will provide symptomatic treatment to include prednisone  40 mg for the next 5 days to help with bronchial inflammation.  Patient advised to continue the cough medication and inhaler previously prescribed.  Supportive care recommendations were provided and discussed with the patient to include fluids, rest, and use of a humidifier.  Discussed indications with the patient regarding when follow-up be indicated.  Patient was in agreement with this plan of care and verbalizes understanding.  All questions were answered.  Patient stable for discharge.   Final Clinical Impressions(s) / UC Diagnoses   Final diagnoses:  Acute bronchitis, unspecified organism     Discharge Instructions      Take medication as prescribed. Increase fluids and allow for plenty of rest. Recommend Tylenol  or ibuprofen  as needed for pain, fever, or general discomfort. Recommend using a humidifier  at bedtime during sleep to help with cough and nasal congestion. Also sleeping elevated will be helpful while your symptoms persist. As discussed, the cough may linger for several weeks. If you are feeling well, but continue to have a persistent cough, drinks fluids and continue OTC cough medication. Follow-up if you develop wheezing, shortness of breath or difficulty breathing. Follow-up as needed.  Follow-up if your symptoms do not improve.      ED Prescriptions     Medication Sig Dispense Auth. Provider   predniSONE  (DELTASONE ) 20 MG tablet Take 2 tablets (40 mg total) by mouth daily with breakfast for 5 days. 10 tablet Leath-Warren, Etta PARAS, NP   albuterol  (VENTOLIN  HFA) 108 (90 Base) MCG/ACT inhaler  (Status: Discontinued) Inhale 2 puffs into the lungs every 6 (six) hours as needed. 8 g Leath-Warren, Etta PARAS, NP      PDMP not reviewed this encounter.   Gilmer Etta PARAS, NP 11/30/23 1028

## 2023-11-30 NOTE — ED Triage Notes (Signed)
 Cough  since the second week of December.  States she feels like she is wheezing.  Cough productive at times.   States she was treated for bacterial bronchitis the 2nd week of December and symptoms still remain.

## 2023-12-03 ENCOUNTER — Encounter (HOSPITAL_COMMUNITY): Payer: Self-pay

## 2023-12-03 ENCOUNTER — Other Ambulatory Visit (HOSPITAL_COMMUNITY): Payer: Self-pay

## 2023-12-03 DIAGNOSIS — Z6833 Body mass index (BMI) 33.0-33.9, adult: Secondary | ICD-10-CM | POA: Diagnosis not present

## 2023-12-03 DIAGNOSIS — Z713 Dietary counseling and surveillance: Secondary | ICD-10-CM | POA: Diagnosis not present

## 2023-12-03 MED ORDER — MOUNJARO 2.5 MG/0.5ML ~~LOC~~ SOAJ
2.5000 mg | SUBCUTANEOUS | 0 refills | Status: DC
  Filled 2023-12-03: qty 2, 28d supply, fill #0

## 2023-12-04 ENCOUNTER — Other Ambulatory Visit (HOSPITAL_COMMUNITY): Payer: Self-pay

## 2023-12-06 ENCOUNTER — Ambulatory Visit (INDEPENDENT_AMBULATORY_CARE_PROVIDER_SITE_OTHER): Payer: Self-pay

## 2023-12-06 ENCOUNTER — Other Ambulatory Visit (HOSPITAL_COMMUNITY): Payer: Self-pay

## 2023-12-06 DIAGNOSIS — J309 Allergic rhinitis, unspecified: Secondary | ICD-10-CM | POA: Diagnosis not present

## 2023-12-06 NOTE — Progress Notes (Signed)
 Immunotherapy   Patient Details  Name: Vanessa [name] MRN: [id number] Date of Birth: [birth year]  12/06/2023  Vanessa [name] here to pick up  red vials of dust mites, molds, cockroaches, grasses, weeds, trees, cats, and dogs. Following schedule: C  Frequency:1 time per week Epi-Pen:Epi-Pen Available  Consent signed previously and patient instructions given on how to properly transport her vials.   [name] 12/06/2023, 11:44 AM

## 2023-12-09 ENCOUNTER — Other Ambulatory Visit (HOSPITAL_COMMUNITY): Payer: Self-pay

## 2023-12-09 MED ORDER — PHENTERMINE HCL 37.5 MG PO TABS
37.5000 mg | ORAL_TABLET | Freq: Every morning | ORAL | 0 refills | Status: DC
  Filled 2023-12-09: qty 30, 30d supply, fill #0

## 2023-12-10 ENCOUNTER — Other Ambulatory Visit (HOSPITAL_COMMUNITY): Payer: Self-pay

## 2023-12-11 ENCOUNTER — Other Ambulatory Visit (HOSPITAL_COMMUNITY): Payer: Self-pay

## 2023-12-11 DIAGNOSIS — Z516 Encounter for desensitization to allergens: Secondary | ICD-10-CM | POA: Diagnosis not present

## 2023-12-17 DIAGNOSIS — Z516 Encounter for desensitization to allergens: Secondary | ICD-10-CM | POA: Diagnosis not present

## 2023-12-23 DIAGNOSIS — R051 Acute cough: Secondary | ICD-10-CM | POA: Diagnosis not present

## 2023-12-24 DIAGNOSIS — Z516 Encounter for desensitization to allergens: Secondary | ICD-10-CM | POA: Diagnosis not present

## 2023-12-31 DIAGNOSIS — Z516 Encounter for desensitization to allergens: Secondary | ICD-10-CM | POA: Diagnosis not present

## 2024-01-10 ENCOUNTER — Other Ambulatory Visit (HOSPITAL_COMMUNITY): Payer: Self-pay

## 2024-01-10 DIAGNOSIS — Z6830 Body mass index (BMI) 30.0-30.9, adult: Secondary | ICD-10-CM | POA: Diagnosis not present

## 2024-01-10 DIAGNOSIS — E669 Obesity, unspecified: Secondary | ICD-10-CM | POA: Diagnosis not present

## 2024-01-10 MED ORDER — PHENTERMINE HCL 37.5 MG PO TABS
37.5000 mg | ORAL_TABLET | Freq: Every morning | ORAL | 0 refills | Status: DC
Start: 2024-01-10 — End: 2024-04-07
  Filled 2024-01-10: qty 90, 90d supply, fill #0

## 2024-01-28 DIAGNOSIS — Z516 Encounter for desensitization to allergens: Secondary | ICD-10-CM | POA: Diagnosis not present

## 2024-02-05 ENCOUNTER — Encounter: Payer: Self-pay | Admitting: Allergy & Immunology

## 2024-02-05 ENCOUNTER — Other Ambulatory Visit: Payer: Self-pay

## 2024-02-05 ENCOUNTER — Ambulatory Visit: Payer: Commercial Managed Care - PPO | Admitting: Allergy & Immunology

## 2024-02-05 VITALS — BP 100/80 | HR 98 | Temp 98.4°F | Ht 64.75 in | Wt 183.4 lb

## 2024-02-05 DIAGNOSIS — J3089 Other allergic rhinitis: Secondary | ICD-10-CM | POA: Diagnosis not present

## 2024-02-05 DIAGNOSIS — J302 Other seasonal allergic rhinitis: Secondary | ICD-10-CM | POA: Diagnosis not present

## 2024-02-05 DIAGNOSIS — L2084 Intrinsic (allergic) eczema: Secondary | ICD-10-CM

## 2024-02-05 DIAGNOSIS — T7803XD Anaphylactic reaction due to other fish, subsequent encounter: Secondary | ICD-10-CM | POA: Diagnosis not present

## 2024-02-05 DIAGNOSIS — J4520 Mild intermittent asthma, uncomplicated: Secondary | ICD-10-CM

## 2024-02-05 NOTE — Progress Notes (Signed)
 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 Margo Aye  Plan/Recommendations:   1. 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 - Let us know when you want to recheck your allergy levels. - The last time we did this was in March 2022.  - EpiPen is up to date [facility] is another option if you want to avoid the needle).    2. Intermittent asthma, uncomplicated - Spirometry looked great.  - Continue with albuterol as needed. - I do not think that we need anything daily for your asthma.   3. Allergic rhinitis (grass, tree, and weed pollens, dust mites, dog, cat, molds, and cockroach) - Continue with allergy shots at the same schedule. - We will plan to wrap up after the next  vial is completed.  - Continue with levocetirizine 5mg  as needed.    4. Return in about 1 year (around 02/04/2025). You can have the follow up appointment with Dr. Dellis Anes or a Nurse Practicioner (our Nurse Practitioners are excellent and always have Physician oversight!).    Subjective:   Vanessa [name] is a 18 y.o. female presenting today for follow up of  Chief Complaint  Patient presents with   Allergic Rhinitis    Asthma    Vanessa [name] has a history of the following: Patient Active Problem List   Diagnosis Date Noted   RBC microcytosis 03/18/2023   Leukocytosis 03/18/2023   Fatigue 02/15/2023   Snoring 02/15/2023   Anxiety 12/28/2022   Sleep disturbance 12/28/2022   Obesity (BMI 30-39.9) 11/08/2022   Menorrhagia with regular cycle 08/11/2022    Dysmenorrhea 08/11/2022   Intrinsic atopic dermatitis 07/27/2022   Idiopathic urticaria 07/27/2022   Acanthosis nigricans 07/29/2020   Iron deficiency anemia 07/29/2020   Mild intermittent asthma without complication 07/02/2018   Seasonal and perennial allergic rhinitis 07/02/2018   Seafood allergy, anaphylaxis, subsequent encounter 07/17/2016   Migraine 12/14/2013   Allergic rhinitis 05/24/2013    History obtained from: chart review and patient.  Discussed the use of AI scribe software for clinical note transcription with the patient and/or guardian, who gave verbal consent to proceed.  Vanessa [name] is a 18 y.o. female presenting for a follow up visit.  She was last seen in September 2023.  At that time, we continue with albuterol 2 puffs every 4 hours as needed.  For her allergic rhinitis, she continue with allergen immunotherapy as well as Flonase and levocetirizine.  For her atopic dermatitis, she was moisturizing twice a day.  She continue to avoid crab and lobster.  EpiPen was renewed.  For her hives, we continue with Xyzal.  Since last visit, she hs done well.   Asthma/Respiratory Symptom History: Her asthma is well-controlled with the use of albuterol as needed. She continues to avoid shellfish but consumes regular fish with scales such as salmon, tuna, and cod. Her shellfish allergy was last rechecked in March 2022.  Allergic Rhinitis Symptom History: She is currently attending [employer] and receives her allergy shots there. She reached maintenance in August 2020, and her treatment is expected to be completed by  August 2025. Her allergies are well-managed, and she no longer uses Xyzal. Her last vial was made in August 2024, and she is currently using it. She experienced an illness in December, for which she received antibiotics. It lingered but eventually cleared on its own.   Vanessa [name] is on allergen immunotherapy. She receives two injections. Immunotherapy script #1 contains trees, weeds,  grasses, cat and dog. She currently receives 0.53mL of the RED vial (1/100). Immunotherapy script #2 contains ragweed, molds, dust mites and cockroach. She currently receives 0.83mL of the RED vial (1/100). She started shots August of 2019 and reached maintenance in August of 2020. She does have some delayed large local reactions.   Food Allergy Symptom History: She continues to avoid shellfish. She does not want to retest. She does have an up to date EpiPen.   Last testing was in March 2022.  This is shown below.     Otherwise, there have been no changes to her past medical history, surgical history, family history, or social history.    Review of systems otherwise negative other than that mentioned in the HPI.    Objective:   Blood pressure 100/80, pulse 98, temperature 98.4 F (36.9 C), height 5' 4.75" (1.645 m), weight 183 lb 6.4 oz (83.2 kg), SpO2 96%. Body mass index is 30.76 kg/m.    Physical Exam Vitals reviewed.  Constitutional:      Appearance: She is well-developed.     Comments: Friendly.   HENT:     Head: Normocephalic and atraumatic.     Right Ear: Tympanic membrane, ear canal and external ear normal. No drainage, swelling or tenderness. Tympanic membrane is not injected, scarred, erythematous, retracted or bulging.     Left Ear: Tympanic membrane, ear canal and external ear normal. No drainage, swelling or tenderness. Tympanic membrane is not injected, scarred, erythematous, retracted or bulging.     Nose: No nasal deformity, septal deviation, mucosal edema or rhinorrhea.     Right Turbinates: Enlarged, swollen and pale.     Left Turbinates: Enlarged, swollen and pale.     Right Sinus: No maxillary sinus tenderness or frontal sinus tenderness.     Left Sinus: No maxillary sinus tenderness or frontal sinus tenderness.     Mouth/Throat:     Mouth: Mucous membranes are not pale and not dry.     Pharynx: Uvula midline.  Eyes:     General:        Right eye: No  discharge.        Left eye: No discharge.     Conjunctiva/sclera: Conjunctivae normal.     Right eye: Right conjunctiva is not injected. No chemosis.    Left eye: Left conjunctiva is not injected. No chemosis.    Pupils: Pupils are equal, round, and reactive to light.  Cardiovascular:     Rate and Rhythm: Normal rate and regular rhythm.     Heart sounds: Normal heart sounds.  Pulmonary:     Effort: Pulmonary effort is normal. No tachypnea, accessory muscle usage or respiratory distress.     Breath sounds: Normal breath sounds. No wheezing, rhonchi or rales.  Chest:     Chest wall: No tenderness.  Lymphadenopathy:     Head:     Right side of head: No submandibular, tonsillar or occipital adenopathy.     Left side of head: No submandibular, tonsillar or occipital adenopathy.     Cervical: No cervical adenopathy.  Skin:    Coloration: Skin is not pale.  Findings: No abrasion, erythema, petechiae or rash. Rash is not papular, urticarial or vesicular.  Neurological:     Mental Status: She is alert.  Psychiatric:        Behavior: Behavior is cooperative.      Diagnostic studies:    Spirometry: results normal (FEV1: 3.50/105%, FVC: 3.82/100%, FEV1/FVC: 92%).    Spirometry consistent with normal pattern.    Allergy Studies: none       Malachi Bonds, MD  [facility]

## 2024-02-05 NOTE — Patient Instructions (Addendum)
 1. 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 - Let us know when you want to recheck your allergy levels. - The last time we did this was in March 2022.  - EpiPen is up to date [facility] is another option if you want to avoid the needle).    2. Intermittent asthma, uncomplicated - Spirometry looked great.  - Continue with albuterol as needed. - I do not think that we need anything daily for your asthma.   3. Allergic rhinitis (grass, tree, and weed pollens, dust mites, dog, cat, molds, and cockroach) - Continue with allergy shots at the same schedule. - We will plan to wrap up after the next  vial is completed.  - Continue with levocetirizine 5mg  as needed.    4. Return in about 1 year (around 02/04/2025). You can have the follow up appointment with Dr. Dellis Anes or a Nurse Practicioner (our Nurse Practitioners are excellent and always have Physician oversight!).    Please inform us of any Emergency Department visits, hospitalizations, or changes in symptoms. Call us before going to the ED for breathing or allergy symptoms since we might be able to fit you in for a sick visit. Feel free to contact us anytime with any questions, problems, or concerns.  It was a pleasure to meet you today!  Websites that have reliable patient information: 1. American Academy of Asthma, Allergy, and Immunology: www.aaaai.org 2. Food Allergy Research and Education (FARE): foodallergy.org 3. Mothers of Asthmatics: http://www.asthmacommunitynetwork.org 4. American College of Allergy, Asthma, and Immunology: www.acaai.org      "Like" [place] on Facebook and Instagram for our latest updates!      A healthy democracy works best when [employer] participate! Make sure you are registered to vote! If you have moved or changed any of your contact information, you will need to get this updated before voting! Scan the QR codes below to learn more!

## 2024-02-07 NOTE — Addendum Note (Signed)
 Addended by: Elsworth Soho on: 02/07/2024 02:12 PM   Modules accepted: Orders

## 2024-04-07 ENCOUNTER — Other Ambulatory Visit (HOSPITAL_COMMUNITY): Payer: Self-pay

## 2024-04-07 DIAGNOSIS — Z6831 Body mass index (BMI) 31.0-31.9, adult: Secondary | ICD-10-CM | POA: Diagnosis not present

## 2024-04-07 DIAGNOSIS — Z713 Dietary counseling and surveillance: Secondary | ICD-10-CM | POA: Diagnosis not present

## 2024-04-08 ENCOUNTER — Other Ambulatory Visit (HOSPITAL_COMMUNITY): Payer: Self-pay

## 2024-04-08 ENCOUNTER — Encounter: Payer: Self-pay | Admitting: Hematology

## 2024-04-08 MED ORDER — CONTRAVE 8-90 MG PO TB12
ORAL_TABLET | ORAL | 3 refills | Status: AC
  Filled 2024-04-08: qty 90, 28d supply, fill #0
  Filled 2024-04-13: qty 90, 35d supply, fill #0
  Filled 2024-06-03: qty 90, 23d supply, fill #1

## 2024-04-13 ENCOUNTER — Encounter: Payer: Self-pay | Admitting: Hematology

## 2024-04-13 ENCOUNTER — Other Ambulatory Visit (HOSPITAL_COMMUNITY): Payer: Self-pay

## 2024-05-08 ENCOUNTER — Other Ambulatory Visit: Payer: Self-pay

## 2024-05-08 DIAGNOSIS — R718 Other abnormality of red blood cells: Secondary | ICD-10-CM

## 2024-05-08 DIAGNOSIS — D509 Iron deficiency anemia, unspecified: Secondary | ICD-10-CM

## 2024-05-11 ENCOUNTER — Inpatient Hospital Stay: Payer: Commercial Managed Care - PPO | Attending: Hematology

## 2024-05-11 DIAGNOSIS — R718 Other abnormality of red blood cells: Secondary | ICD-10-CM

## 2024-05-11 DIAGNOSIS — D72828 Other elevated white blood cell count: Secondary | ICD-10-CM | POA: Diagnosis not present

## 2024-05-11 DIAGNOSIS — D509 Iron deficiency anemia, unspecified: Secondary | ICD-10-CM | POA: Diagnosis not present

## 2024-05-11 DIAGNOSIS — D72825 Bandemia: Secondary | ICD-10-CM

## 2024-05-11 LAB — CBC WITH DIFFERENTIAL/PLATELET
Abs Immature Granulocytes: 0.07 10*3/uL (ref 0.00–0.07)
Basophils Absolute: 0.1 10*3/uL (ref 0.0–0.1)
Basophils Relative: 1 %
Eosinophils Absolute: 0.3 10*3/uL (ref 0.0–0.5)
Eosinophils Relative: 3 %
HCT: 37.1 % (ref 36.0–46.0)
Hemoglobin: 11.7 g/dL — ABNORMAL LOW (ref 12.0–15.0)
Immature Granulocytes: 1 %
Lymphocytes Relative: 22 %
Lymphs Abs: 2.4 10*3/uL (ref 0.7–4.0)
MCH: 20.5 pg — ABNORMAL LOW (ref 26.0–34.0)
MCHC: 31.5 g/dL (ref 30.0–36.0)
MCV: 65.1 fL — ABNORMAL LOW (ref 80.0–100.0)
Monocytes Absolute: 0.7 10*3/uL (ref 0.1–1.0)
Monocytes Relative: 7 %
Neutro Abs: 7.6 10*3/uL (ref 1.7–7.7)
Neutrophils Relative %: 66 %
Platelets: 309 10*3/uL (ref 150–400)
RBC: 5.7 MIL/uL — ABNORMAL HIGH (ref 3.87–5.11)
RDW: 19.6 % — ABNORMAL HIGH (ref 11.5–15.5)
WBC: 11.2 10*3/uL — ABNORMAL HIGH (ref 4.0–10.5)
nRBC: 0 % (ref 0.0–0.2)

## 2024-05-11 LAB — C-REACTIVE PROTEIN: CRP: 0.7 mg/dL (ref <1.0)

## 2024-05-11 LAB — FOLATE: Folate: 7.4 ng/mL (ref >5.9)

## 2024-05-11 LAB — IRON AND TIBC
Iron: 74 ug/dL (ref 28–170)
Saturation Ratios: 22 % (ref 10.4–31.8)
TIBC: 339 ug/dL (ref 250–450)
UIBC: 265 ug/dL

## 2024-05-11 LAB — SEDIMENTATION RATE: Sed Rate: 18 mm/h (ref 0–20)

## 2024-05-11 LAB — FERRITIN: Ferritin: 175 ng/mL (ref 11–307)

## 2024-05-11 LAB — VITAMIN B12: Vitamin B-12: 264 pg/mL (ref 180–914)

## 2024-05-12 LAB — ANA: Anti Nuclear Antibody (ANA): NEGATIVE

## 2024-05-12 LAB — RHEUMATOID FACTOR: Rheumatoid fact SerPl-aCnc: <10 [IU]/mL (ref <14.0)

## 2024-05-14 LAB — METHYLMALONIC ACID, SERUM: Methylmalonic Acid, Quantitative: 81 nmol/L (ref 0–378)

## 2024-05-16 NOTE — Progress Notes (Unsigned)
 [facility] [address], [place] [address]   CLINIC:  Medical Oncology/Hematology  PCP:  Cook, Jayce G, DO [address] [place] [address] [phone]   REASON FOR VISIT:  Follow-up for leukocytosis and microcytic anemia  PRIOR THERAPY: None  CURRENT THERAPY: IV iron  as needed  INTERVAL HISTORY:   [name] 18 y.o. female returns for routine follow-up of her leukocytosis and microcytic anemia.  At today's visit, she reports feeling well.  No recent hospitalizations, surgeries, or changes in baseline health status.  She denies any fatigue. She denies any pica, headaches, lightheadedness, syncope, chest pain, or dyspnea on exertion.   She has not had menstrual period since IUD placed in February 2024.   She denies any rectal bleeding or melena.  No recent infections or steroid use.   She denies any B symptoms, masses, or lymphadenopathy. No history of chronic inflammatory disease.  She does not smoke or vape.    She has 100% energy and 100% appetite. She endorses that she is maintaining a stable weight.  ASSESSMENT & PLAN:  1.  RBC microcytosis + iron  deficiency anemia - Mild anemia since at least 2019, moderate to severe microcytosis (MCV in the 60s) since 2019. - Seen at the request of Aleck Quarry, NP due to CBC from 02/20/2023 showing hemoglobin 10.7 with MCV 64.  Ferritin was 45 with iron  saturation 19%, folate 6, and vitamin B12 239.  (Further workup showed normal MMA, copper , reticulocytes, LDH) - Patient mother and maternal grandmother also have microcytosis, but no family history of known thalassemia or sickle cell anemia - Hemoglobin electrophoresis (06/03/2023) showed elevated Hgb F (4.0) and decreased Hgb A (93.3).  This may indicate presence of hereditary persistence gene, but can also be seen in other clinical settings.  (Hemoglobin electrophoresis repeated on 11/15/2023 following IV iron  repletion, with similar results.) -  History of menorrhagia and dysmenorrhea, Mirena  IUD placed, no bleeding since February 2024  - No rectal bleeding or melena  - Integra (FeSO4) started on 03/15/2023, but unable to tolerate due to gastric upset  - Received IV iron  with INFeD  1000 mg on 07/08/2023 (PREMEDICATED with steroid, H1 and H2 blockers due to history of severe shellfish allergy).  Energy improved after IV iron . - Previously took a daily multivitamin, but has not taken this for the past 4 to 6 months. - Most recent labs (05/11/2024): Hgb improved at 11.7 but with persistent microcytosis (MCV 65.1) and compensatory increase in RBC (5.70).  Persistent hypochromia (MCH 20.5) and elevated RDW (19.6).  Ferritin 175, iron  saturation 22%. 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 (Hgb F 2-20% with normal or low Hb A2 levels, and main RDW 20.14+/-1.21) Non-deletional HPFH (less likely, as RBC indices are typically normal or near normal despite elevated levels of HbF) Beta thalassemia minor (less likely, as Hgb A2 levels normal) - PLAN: Discussed with patient that genetic testing would be needed to confirm diagnosis of delta beta thalassemia.  Patient will consider this, but would like to discuss further with her mother.  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 - Recommend restarting daily multivitamin.  Would consider B12 and folic acid  supplementation if no improvement at next visit. - Repeat labs in 1 year, to include  CBC/D, ferritin, iron /TIBC, B12, MMA, folate.   (If overall stable, would consider discharge to PCP at that time.)   2.  Leukocytosis, predominantly neutrophils: - She has neutrophilic leukocytosis since 2019. - No history of splenectomy.  No history of chronic inflammatory disease.  She does not smoke or vape. - No recurrent infections or chronic steroid use. - We reviewed MPN testing which showed negative mutations for JAK2 V617F, CALR, MPL, Exons 12-15 and BCR/ABL. - Inflammatory labs (05/11/2024): Negative RF, ANA, CRP, ESR - No B symptoms, masses, or lymphadenopathy. - Most recent CBC (05/11/2024) with stable mild leukocytosis WBC 11.2 (normal differential) - PLAN: Suspect reactive leukocytosis in the setting of obesity.  Continue surveillance.  If she remains overall stable over the next year, would consider discharge to PCP for future surveillance.    3.  Social/family history: - Mother has microcytosis, maternal grandmother also has microcytosis.  No family history of leukemia or malignancies.  No family history of known thalassemia or sickle cell anemia.    PLAN SUMMARY:  >> Labs in 1 year = CBC/D, ferritin, iron /TIBC, B12, MMA, folate, LDH >> OFFICE visit in 1 year (1 week after labs)     REVIEW OF SYSTEMS:   Review of Systems  Constitutional:  Negative for appetite change, chills, diaphoresis, fatigue, fever and unexpected weight change.  HENT:   Negative for lump/mass and nosebleeds.   Eyes:  Negative for eye problems.  Respiratory:  Negative for cough, hemoptysis and shortness of breath.   Cardiovascular:  Negative for chest pain, leg swelling and palpitations.  Gastrointestinal:  Negative for abdominal pain, blood in stool, constipation, diarrhea, nausea and vomiting.  Genitourinary:  Negative for hematuria.   Skin: Negative.   Neurological:  Negative for dizziness, headaches and light-headedness.  Hematological:  Does not bruise/bleed easily.     PHYSICAL EXAM:  ECOG  PERFORMANCE STATUS: 0 - Asymptomatic  Vitals:   05/18/24 1305  BP: 122/73  Pulse: 60  Resp: 16  Temp: 98.8 F (37.1 C)  SpO2: 100%    Filed Weights   05/18/24 1305  Weight: 187 lb 6.3 oz (85 kg)    Physical Exam Constitutional:      Appearance: Normal appearance. She is obese.   Cardiovascular:     Heart sounds: Normal heart sounds.  Pulmonary:     Breath sounds: Normal breath sounds.  Lymphadenopathy:     Cervical: No cervical adenopathy.   Neurological:     General: No focal deficit present.     Mental Status: Mental status is at baseline.   Psychiatric:        Behavior: Behavior normal. Behavior is cooperative.     PAST MEDICAL/SURGICAL HISTORY:  Past Medical History:  Diagnosis Date   Anxiety    Asthma    Headache    Idiopathic urticaria 07/27/2022   Intrinsic atopic dermatitis 07/27/2022   Multiple food allergies    Obesity    Sleep disturbance    Past Surgical History:  Procedure Laterality Date   STRABISMUS SURGERY     TONSILLECTOMY      SOCIAL HISTORY:  Social History   Socioeconomic History   Marital status: Single    Spouse name: Not on file   Number of children: Not on file   Years  of education: Not on file   Highest education level: Not on file  Occupational History   Occupation: Consulting civil engineer, Work 2 jobs  Tobacco Use   Smoking status: Never   Smokeless tobacco: Never  Vaping Use   Vaping status: Never Used  Substance and Sexual Activity   Alcohol use: No   Drug use: No   Sexual activity: Never  Other Topics Concern   Not on file  Social History Narrative   Patient doing well in school.Living with her parents and older siblings. She enjoys dance, swimming, and tennis.    Social Drivers of Corporate investment banker Strain: Low Risk  (10/26/2022)   Overall Financial Resource Strain (CARDIA)    Difficulty of Paying Living Expenses: Not hard at all  Food Insecurity: No Food Insecurity (03/18/2023)   Hunger Vital Sign    Worried  About Running Out of Food in the Last Year: Never true    Ran Out of Food in the Last Year: Never true  Transportation Needs: No Transportation Needs (03/18/2023)   PRAPARE - Administrator, Civil Service (Medical): No    Lack of Transportation (Non-Medical): No  Physical Activity: Inactive (10/26/2022)   Exercise Vital Sign    Days of Exercise per Week: 0 days    Minutes of Exercise per Session: 0 min  Stress: No Stress Concern Present (10/26/2022)   [employer] of Occupational Health - Occupational Stress Questionnaire    Feeling of Stress : Only a little  Social Connections: Moderately Integrated (10/26/2022)   Social Connection and Isolation Panel    Frequency of Communication with Friends and Family: More than three times a week    Frequency of Social Gatherings with Friends and Family: More than three times a week    Attends Religious Services: 1 to 4 times per year    Active Member of [employer] or Organizations: Yes    Attends Engineer, structural: More than 4 times per year    Marital Status: Never married  Intimate Partner Violence: Not At Risk (03/18/2023)   Humiliation, Afraid, Rape, and Kick questionnaire    Fear of Current or Ex-Partner: No    Emotionally Abused: No    Physically Abused: No    Sexually Abused: No    FAMILY HISTORY:  Family History  Problem Relation Age of Onset   Depression Mother    Anxiety disorder Mother    Cancer Mother    Hyperlipidemia Mother    Hypertension Mother    Diabetes Mother    Migraines Mother    Obesity Mother    Hyperlipidemia Father    Hypertension Father    Headache Father    Allergic rhinitis Father    Sleep apnea Father    Alcoholism Father    Migraines Sister    Depression Sister    Anxiety disorder Sister    Heart attack Maternal Grandfather    Heart attack Paternal Grandfather    Angioedema Neg Hx    Asthma Neg Hx    Eczema Neg Hx    Immunodeficiency Neg Hx    Urticaria Neg Hx     CURRENT  MEDICATIONS:  Outpatient Encounter Medications as of 05/18/2024  Medication Sig   levonorgestrel  (MIRENA ) 20 MCG/DAY IUD 1 each by Intrauterine route once.   Naltrexone -buPROPion  HCl ER (CONTRAVE ) 8-90 MG TB12 Take 1 tablet by mouth in the morning for 7 days, THEN 1 tablet 2 (two) times daily for 7 days, THEN 2 tablets  in the AM and 1 tablet in the PM for 7 days, THEN 2 tablets 2 (two) times daily thereafter.   traZODone  (DESYREL ) 50 MG tablet Take 0.5-1 tablets (25-50 mg total) by mouth at bedtime as needed for sleep.   [DISCONTINUED] phentermine  (ADIPEX-P ) 37.5 MG tablet Take 1 tablet (37.5 mg total) by mouth every morning.   No facility-administered encounter medications on file as of 05/18/2024.    ALLERGIES:  Allergies  Allergen Reactions   Shellfish Allergy Shortness Of Breath    LABORATORY DATA:  I have reviewed the labs as listed.  CBC    Component Value Date/Time   WBC 11.2 (H) 05/11/2024 1404   RBC 5.70 (H) 05/11/2024 1404   HGB 11.7 (L) 05/11/2024 1404   HGB 10.7 (L) 02/20/2023 1523   HCT 37.1 05/11/2024 1404   HCT 38.1 03/13/2023 0905   PLT 309 05/11/2024 1404   PLT 339 02/20/2023 1523   MCV 65.1 (L) 05/11/2024 1404   MCV 64 (L) 02/20/2023 1523   MCH 20.5 (L) 05/11/2024 1404   MCHC 31.5 05/11/2024 1404   RDW 19.6 (H) 05/11/2024 1404   RDW 19.8 (H) 02/20/2023 1523   LYMPHSABS 2.4 05/11/2024 1404   LYMPHSABS 2.8 02/20/2023 1523   MONOABS 0.7 05/11/2024 1404   EOSABS 0.3 05/11/2024 1404   EOSABS 0.3 02/20/2023 1523   BASOSABS 0.1 05/11/2024 1404   BASOSABS 0.1 02/20/2023 1523      Latest Ref Rng & Units 03/25/2023    8:54 PM 02/20/2023    3:23 PM 07/29/2020    4:23 PM  CMP  Glucose 70 - 99 mg/dL 893  92  83   BUN 6 - 20 mg/dL 13  8  8    Creatinine 0.44 - 1.00 mg/dL 9.49  9.41  9.46   Sodium 135 - 145 mmol/L 137  139  137   Potassium 3.5 - 5.1 mmol/L 3.8  4.0  4.3   Chloride 98 - 111 mmol/L 103  102  101   CO2 22 - 32 mmol/L 25  21  21    Calcium  8.9 - 10.3  mg/dL 9.1  9.8  9.9   Total Protein 6.5 - 8.1 g/dL 8.2  6.8  7.4   Total Bilirubin 0.3 - 1.2 mg/dL 0.7  0.5  0.4   Alkaline Phos 38 - 126 U/L 80  92  109   AST 15 - 41 U/L 13  10  9    ALT 0 - 44 U/L 13  12  7      DIAGNOSTIC IMAGING:  I have independently reviewed the relevant imaging and discussed with the patient.   WRAP UP:  All questions were answered. The patient knows to call the clinic with any problems, questions or concerns.  Medical decision making: Moderate  Time spent on visit: I spent 20 minutes counseling the patient face to face. The total time spent in the appointment was 30 minutes and more than 50% was on counseling.  Pleasant CHRISTELLA Barefoot, PA-C  05/18/24 1:40 PM

## 2024-05-18 ENCOUNTER — Encounter: Payer: Self-pay | Admitting: Physician Assistant

## 2024-05-18 ENCOUNTER — Inpatient Hospital Stay (HOSPITAL_BASED_OUTPATIENT_CLINIC_OR_DEPARTMENT_OTHER): Payer: Commercial Managed Care - PPO | Admitting: Physician Assistant

## 2024-05-18 VITALS — BP 122/73 | HR 60 | Temp 98.8°F | Resp 16 | Wt 187.4 lb

## 2024-05-18 DIAGNOSIS — D509 Iron deficiency anemia, unspecified: Secondary | ICD-10-CM | POA: Diagnosis not present

## 2024-05-18 DIAGNOSIS — D569 Thalassemia, unspecified: Secondary | ICD-10-CM | POA: Diagnosis not present

## 2024-05-18 DIAGNOSIS — D72825 Bandemia: Secondary | ICD-10-CM | POA: Diagnosis not present

## 2024-05-18 DIAGNOSIS — R718 Other abnormality of red blood cells: Secondary | ICD-10-CM

## 2024-05-18 DIAGNOSIS — D72828 Other elevated white blood cell count: Secondary | ICD-10-CM | POA: Diagnosis not present

## 2024-05-18 NOTE — Patient Instructions (Signed)
 [facility] at [facility] **VISIT SUMMARY & IMPORTANT INSTRUCTIONS **   You were seen today by Pleasant Barefoot PA-C for your follow-up visit.    ANEMIA & MICROCYTOSIS (small red blood cells) Your chronic mild anemia and small red blood cells are most likely due to a benign inherited genetic trait that causes abnormal hemoglobin. 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 This condition is unlikely to cause any severe problems for your health, but could have reproductive implications if/when you are considering having children. Genetic testing would be needed to confirm this diagnosis. It is important that you have enough vitamin B12 and folic acid .  Please take a daily multivitamin - even though you are not pregnant or planning to become pregnant in the near future, prenatal vitamins are a great source of these vitamins and minerals! We will plan on seeing you for labs and an office visit in 1 year -- please contact us  sooner if you decide that you would like to move forward with genetic testing.  ELEVATED WHITE BLOOD CELLS Your elevated white blood cells appear to be reactive.  This means that they are most likely related to underlying inflammation, which may or may not be related to elevated BMI. There is no evidence of any cancerous cause of your high white blood cells at this time, but we will continue to monitor them.  FOLLOW-UP APPOINTMENT: Labs and office visit in 1 year or  ** Thank you for trusting me with your healthcare!  I strive to provide all of my patients with quality care at each visit.  If you receive a survey for this visit, I would be so grateful to you for taking the time to provide feedback.  Thank you in advance!  ~ Tajanay Hurley                   Dr. Alean Stands   &   Pleasant Barefoot, PA-C   - - - - - - - - - - - - - - - - - -    Thank you for choosing [facility] at [facility] to provide your oncology and hematology care.  To afford each patient quality time with our provider, please arrive at least 15 minutes before your scheduled appointment time.   If you have a lab appointment with the Cancer Center please come in thru the Main Entrance and check in at the main information desk.  You need to re-schedule your appointment should you arrive 10 or more minutes late.  We strive to give you quality time with our providers, and arriving late affects you and other patients whose appointments are after yours.  Also, if you no show three or more times for appointments you may be dismissed from the clinic at the providers discretion.     Again, thank you for choosing [facility].  Our hope is that these requests will decrease the amount of time that you wait before being seen by our physicians.       _____________________________________________________________  Should you have questions after your visit to [facility], please contact our office at [phone] and follow the prompts.  Our office hours are 8:00 a.m. and 4:30 p.m. Monday - Friday.  Please note that voicemails left after 4:00 p.m. may not be returned until the following business day.  We  are closed weekends and major holidays.  You do have access to a nurse 24-7, just call the main number to the clinic [phone] and do not press any options, hold on the line and a nurse will answer the phone.    For prescription refill requests, have your pharmacy contact our office and allow 72 hours.

## 2024-06-04 ENCOUNTER — Other Ambulatory Visit (HOSPITAL_COMMUNITY): Payer: Self-pay

## 2024-06-23 ENCOUNTER — Other Ambulatory Visit: Payer: Self-pay | Admitting: Physician Assistant

## 2024-06-23 ENCOUNTER — Encounter: Payer: Self-pay | Admitting: Physician Assistant

## 2024-06-23 DIAGNOSIS — R718 Other abnormality of red blood cells: Secondary | ICD-10-CM

## 2024-06-23 DIAGNOSIS — D569 Thalassemia, unspecified: Secondary | ICD-10-CM

## 2024-06-23 NOTE — Progress Notes (Addendum)
 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    **Of note, it has come to light that the patient's mother also has suspected HPFH, based on hemoglobin electrophoresis, but not confirmed with genetic testing.  I have discussed genetic testing in detail with the patient, and she would like to proceed for definitive diagnosis.  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  We will order the following tests: - HBB full gene sequencing ([facility] [id number]) - HBB deletion/duplication analysis ([facility] [phone])  Per [employer], signed informed consent for genetic testing will be obtained and sent to [facility] with blood sample.  Additional copy will be scanned to patient's chart.  If positive, we will send patient for prenatal genetic counseling to discuss recurrence risk in offspring.  Pleasant CHRISTELLA Barefoot, PA-C 06/23/24 5:00 PM

## 2024-06-24 ENCOUNTER — Inpatient Hospital Stay: Attending: Hematology

## 2024-06-24 DIAGNOSIS — R718 Other abnormality of red blood cells: Secondary | ICD-10-CM

## 2024-06-24 DIAGNOSIS — D509 Iron deficiency anemia, unspecified: Secondary | ICD-10-CM | POA: Diagnosis not present

## 2024-06-24 DIAGNOSIS — D569 Thalassemia, unspecified: Secondary | ICD-10-CM

## 2024-07-03 LAB — MISC LABCORP TEST (SEND OUT): Labcorp test code: 252823

## 2024-07-07 LAB — MISC LABCORP TEST (SEND OUT): Labcorp test code: 252240

## 2024-07-15 ENCOUNTER — Ambulatory Visit: Payer: Self-pay | Admitting: Physician Assistant

## 2024-07-15 DIAGNOSIS — D569 Thalassemia, unspecified: Secondary | ICD-10-CM

## 2024-07-30 ENCOUNTER — Encounter

## 2024-09-02 ENCOUNTER — Ambulatory Visit

## 2024-09-04 ENCOUNTER — Ambulatory Visit: Attending: Obstetrics and Gynecology

## 2024-09-04 DIAGNOSIS — Z148 Genetic carrier of other disease: Secondary | ICD-10-CM

## 2024-09-04 NOTE — Progress Notes (Signed)
 [facility] at [facility] [address] Phone:  [phone]   Fax:  [phone]      In-Person Genetic Counseling Clinic Note:   I spoke with 18 y.o. Vanessa [name] today for preconception counseling to discuss her recent genetic testing results. She was referred by Vanessa [name] HERO, PA-C.   Pregnancy History:    G0. The patient is not currently TTC. She is not currently in a relationship. Personal history of microcytic anemia and leukocytosis. Denies other personal health concerns. We discussed folic acid  intake as recommended by OB, typically 400 mcg daily at least 1-3 months before pregnancy and during the first 12 weeks of pregnancy if there is no personal/family history of NTDs. Patient states she already takes folic acid . Reports occasional alcohol use. Denies using tobacco or street drugs.    Family History:    A three-generation pedigree was created and scanned into [facility] under the Media tab.  Patient reports she and her mother both see hematology for anemia. She reports her mother is more severely affected. She is not aware of her mother having genetic testing.  Patient's maternal ethnicity reported as Hispanic and paternal ethnicity reported as Hispanic. Denies Ashkenazi Jewish ancestry.  Family history not remarkable for consanguinity, individuals with birth defects, intellectual disability, autism spectrum disorder, multiple spontaneous abortions, still births, or unexplained neonatal death.   Heterozygous Delta-Beta Thalassemia:  Vanessa [name] had genetic testing performed by her hematologist due to her history of microcytic anemia. Genetic testing was performed by [facility] for sequencing and deletion/duplication analysis of the HBB genes.  Sequencing of the HBB gene was negative; no pathogenic variants were detected. 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 This can be associated with delta-beta thalassemia or hereditary persistence of fetal hemoglobin (HPFH).  We reviewed the significance of the HBB and HBD genes in their production of beta and delta subunits of hemoglobin, respectively. We reviewed the differences between the common forms of hemoglobin (A, A2, F) and their respective subunits. We discussed that since her other copy of each HBB and HBD are functional, she is still able to produce HbA and HbA2; however, the overall decreased amounts of the beta and delta globins ultimately cause an increase in the production of gamma globin. This in turn causes an increase in fetal hemoglobin (HbF) production.  We briefly discussed the differences between delta-beta thalassemia and HPFH. Delta-beta thalassemia is typically associated with microcytosis, low or normal HbA2, and elevated HbF (~5-20%). Heterozygotes are typically only mildly affected with manifestations similar to beta thalassemia trait. However, homozygotes may be more severely affected and will have higher HbF levels. On the other hand, heterozygotes with HPFH typically have higher HbF levels (~15-30%) than in delta-beta thalassemia, and it is often associated with normal red blood cell indices. Patients with HPFH are generally asymptomatic. Per the patient's hemoglobin evaluation, she may have the delta-beta thalassemia as opposed to HPFH, but the official diagnosis is ultimately deferred to hematology.  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 We reviewed that if her future reproductive partner were to  also be a carrier for a beta-hemoglobinopathy, there would be a 25% chance of having an affected pregnancy  due to the autosomal recessive mode of inheritance. The severity of symptoms is dependent on the genotype. Therefore, it is recommended that her future reproductive partner be screened for at least beta-hemoglobinopathies (HBB gene sequencing and del/dup).  Her mother may consider genetic testing to determine if she also has the deletion that Vanessa [name] has as well as other HBB variants that may be increasing her symptom severity. Vanessa [name]'s siblings can also consider testing to determine if they also have the deletion, as this can impact their reproductive risks.   Carrier Screening Offered:  We discussed and offered carrier screening per the ACOG Committee Opinion 691 as well as expanded carrier screening for autosomal recessive and X-linked conditions. The technical aspects, benefits, risks, and limitations of carrier screening were discussed. We reviewed that if the patient is found to be a carrier for a genetic condition, carrier screening is available for the partner. If both are found to be carriers for the same condition, there is a 25% chance of the pregnancy to be affected. If both are carriers, prenatal diagnosis is an option. PGT-M testing on embryos through IVF for single gene conditions can also be performed.  She will consider this and inform us  if she wishes to proceed with testing.   Plan of Care:   Patient will consider carrier screening options and inform us  if she wishes to proceed with testing. It is recommended her future reproductive partner be screened for at least beta-hemoglobinopathies (HBB gene). Follow-up with hematology as needed.   Informed consent was obtained. All questions were answered.   110 minutes were spent on the date of the encounter in service to the patient including preparation (30 min), face-to-face consultation (30 min), discussion of test reports and available next steps, pedigree construction, genetic risk assessment, documentation (50 min), and care  coordination.    Thank you for sharing in the care of Vanessa [name] with us .  Please do not hesitate to contact us  at [phone] if you have any questions.   Lauraine Bodily, MS, [facility] Certified Genetic Counselor   Genetic counseling student involved in appointment: No.

## 2024-09-07 DIAGNOSIS — Z148 Genetic carrier of other disease: Secondary | ICD-10-CM | POA: Insufficient documentation

## 2024-10-15 DIAGNOSIS — Z0000 Encounter for general adult medical examination without abnormal findings: Secondary | ICD-10-CM | POA: Diagnosis not present

## 2024-10-15 DIAGNOSIS — Z0100 Encounter for examination of eyes and vision without abnormal findings: Secondary | ICD-10-CM | POA: Diagnosis not present

## 2024-10-21 ENCOUNTER — Ambulatory Visit: Payer: Self-pay | Admitting: Family Medicine

## 2025-02-05 ENCOUNTER — Ambulatory Visit: Admitting: Allergy & Immunology

## 2025-05-10 ENCOUNTER — Other Ambulatory Visit

## 2025-05-17 ENCOUNTER — Ambulatory Visit: Admitting: Physician Assistant
